# Patient Record
Sex: Female | Born: 1975 | Race: Black or African American | Hispanic: No | Marital: Married | Smoking: Former smoker
Health system: Southern US, Community
[De-identification: ages and names within clinical notes are randomized; demographics above are authoritative.]

## PROBLEM LIST (undated history)

## (undated) ENCOUNTER — Inpatient Hospital Stay (HOSPITAL_COMMUNITY): Payer: Self-pay

## (undated) DIAGNOSIS — E785 Hyperlipidemia, unspecified: Secondary | ICD-10-CM

## (undated) DIAGNOSIS — E118 Type 2 diabetes mellitus with unspecified complications: Secondary | ICD-10-CM

## (undated) DIAGNOSIS — I10 Essential (primary) hypertension: Secondary | ICD-10-CM

## (undated) DIAGNOSIS — J449 Chronic obstructive pulmonary disease, unspecified: Secondary | ICD-10-CM

## (undated) DIAGNOSIS — Z9981 Dependence on supplemental oxygen: Secondary | ICD-10-CM

## (undated) DIAGNOSIS — I471 Supraventricular tachycardia, unspecified: Secondary | ICD-10-CM

## (undated) DIAGNOSIS — E119 Type 2 diabetes mellitus without complications: Secondary | ICD-10-CM

## (undated) DIAGNOSIS — A599 Trichomoniasis, unspecified: Secondary | ICD-10-CM

## (undated) HISTORY — DX: Chronic obstructive pulmonary disease, unspecified: J44.9

## (undated) HISTORY — PX: TUBAL LIGATION: SHX77

## (undated) HISTORY — DX: Supraventricular tachycardia: I47.1

## (undated) HISTORY — DX: Hyperlipidemia, unspecified: E78.5

## (undated) HISTORY — PX: OTHER SURGICAL HISTORY: SHX169

## (undated) HISTORY — DX: Type 2 diabetes mellitus with unspecified complications: E11.8

## (undated) HISTORY — DX: Type 2 diabetes mellitus without complications: E11.9

## (undated) HISTORY — PX: SALPINGECTOMY: SHX328

## (undated) HISTORY — DX: Supraventricular tachycardia, unspecified: I47.10

## (undated) HISTORY — DX: Dependence on supplemental oxygen: Z99.81

---

## 2010-06-12 HISTORY — PX: OTHER SURGICAL HISTORY: SHX169

## 2013-05-28 ENCOUNTER — Inpatient Hospital Stay (HOSPITAL_COMMUNITY)
Admission: AD | Admit: 2013-05-28 | Discharge: 2013-05-29 | Disposition: A | Discharge status: Home / Self Care | Payer: Medicare Other | Source: Ambulatory Visit | Attending: Obstetrics and Gynecology | Admitting: Obstetrics and Gynecology

## 2013-05-28 ENCOUNTER — Encounter (HOSPITAL_COMMUNITY): Payer: Self-pay

## 2013-05-28 ENCOUNTER — Inpatient Hospital Stay (HOSPITAL_COMMUNITY): Payer: Medicare Other

## 2013-05-28 DIAGNOSIS — O00109 Unspecified tubal pregnancy without intrauterine pregnancy: Secondary | ICD-10-CM | POA: Insufficient documentation

## 2013-05-28 HISTORY — DX: Essential (primary) hypertension: I10

## 2013-05-28 LAB — URINALYSIS, ROUTINE W REFLEX MICROSCOPIC
Glucose, UA: NEGATIVE mg/dL
Protein, ur: NEGATIVE mg/dL
Specific Gravity, Urine: 1.025 (ref 1.005–1.030)
pH: 6 (ref 5.0–8.0)

## 2013-05-28 LAB — URINE MICROSCOPIC-ADD ON

## 2013-05-28 LAB — POCT PREGNANCY, URINE: Preg Test, Ur: POSITIVE — AB

## 2013-05-28 NOTE — MAU Note (Signed)
[facility] pregnancy [facility] at [facility], given due date of 01/03/14. Lower abdominal cramping tonight. Hx of ruptured ectopic 1 yr ago. Denies vaginal bleeding or discharge.

## 2013-05-29 ENCOUNTER — Encounter (HOSPITAL_COMMUNITY)
Admission: AD | Disposition: A | Discharge status: Home / Self Care | Payer: Self-pay | Source: Ambulatory Visit | Attending: Obstetrics and Gynecology

## 2013-05-29 ENCOUNTER — Encounter (HOSPITAL_COMMUNITY): Payer: Medicare Other | Admitting: Anesthesiology

## 2013-05-29 ENCOUNTER — Inpatient Hospital Stay (HOSPITAL_COMMUNITY): Payer: Medicare Other | Admitting: Anesthesiology

## 2013-05-29 ENCOUNTER — Encounter (HOSPITAL_COMMUNITY): Payer: Self-pay | Admitting: *Deleted

## 2013-05-29 HISTORY — PX: LAPAROSCOPY: SHX197

## 2013-05-29 LAB — CBC
MCH: 30.5 pg (ref 26.0–34.0)
Platelets: 300 10*3/uL (ref 150–400)
RBC: 3.83 MIL/uL — ABNORMAL LOW (ref 3.87–5.11)
RDW: 13.5 % (ref 11.5–15.5)
WBC: 9.2 10*3/uL (ref 4.0–10.5)

## 2013-05-29 LAB — GC/CHLAMYDIA PROBE AMP: GC Probe RNA: NEGATIVE

## 2013-05-29 LAB — COMPREHENSIVE METABOLIC PANEL
ALT: 29 U/L (ref 0–35)
AST: 21 U/L (ref 0–37)
Albumin: 3.6 g/dL (ref 3.5–5.2)
Alkaline Phosphatase: 79 U/L (ref 39–117)
CO2: 25 mEq/L (ref 19–32)
Chloride: 100 mEq/L (ref 96–112)
GFR calc non Af Amer: >90 mL/min (ref >90)
Glucose, Bld: 106 mg/dL — ABNORMAL HIGH (ref 70–99)
Potassium: 4.3 mEq/L (ref 3.5–5.1)
Sodium: 134 mEq/L — ABNORMAL LOW (ref 135–145)
Total Bilirubin: 0.1 mg/dL — ABNORMAL LOW (ref 0.3–1.2)

## 2013-05-29 LAB — HCG, QUANTITATIVE, PREGNANCY: hCG, Beta Chain, Quant, S: 5843 m[IU]/mL — ABNORMAL HIGH (ref <5)

## 2013-05-29 LAB — TYPE AND SCREEN
ABO/RH(D): B POS
Antibody Screen: NEGATIVE

## 2013-05-29 LAB — ABO/RH: ABO/RH(D): B POS

## 2013-05-29 LAB — WET PREP, GENITAL
Clue Cells Wet Prep HPF POC: NONE SEEN
Trich, Wet Prep: NONE SEEN

## 2013-05-29 SURGERY — LAPAROSCOPY OPERATIVE
Anesthesia: General | Site: Abdomen

## 2013-05-29 MED ORDER — VASOPRESSIN 20 UNIT/ML IJ SOLN
INTRAMUSCULAR | Status: DC | PRN
  Administered 2013-05-29: .6 [IU]

## 2013-05-29 MED ORDER — GLYCOPYRROLATE 0.2 MG/ML IJ SOLN
INTRAMUSCULAR | Status: DC | PRN
  Administered 2013-05-29: .6 mg via INTRAVENOUS

## 2013-05-29 MED ORDER — SODIUM CHLORIDE 0.9 % IJ SOLN
INTRAMUSCULAR | Status: DC | PRN
  Administered 2013-05-29: 3 mL via INTRAVENOUS

## 2013-05-29 MED ORDER — HYDROCODONE-ACETAMINOPHEN 5-325 MG PO TABS
ORAL_TABLET | ORAL | Status: AC
  Administered 2013-05-29: 2 via ORAL
  Filled 2013-05-29: qty 2

## 2013-05-29 MED ORDER — METOCLOPRAMIDE HCL 5 MG/ML IJ SOLN
10.0000 mg | Freq: Once | INTRAMUSCULAR | Status: AC
  Administered 2013-05-29: 10 mg via INTRAVENOUS
  Filled 2013-05-29: qty 2

## 2013-05-29 MED ORDER — SUCCINYLCHOLINE CHLORIDE 20 MG/ML IJ SOLN
INTRAMUSCULAR | Status: DC | PRN
  Administered 2013-05-29: 80 mg via INTRAVENOUS

## 2013-05-29 MED ORDER — NEOSTIGMINE METHYLSULFATE 1 MG/ML IJ SOLN
INTRAMUSCULAR | Status: DC | PRN
  Administered 2013-05-29: 3 mg via INTRAVENOUS

## 2013-05-29 MED ORDER — HYDROMORPHONE HCL PF 2 MG/ML IJ SOLN: 2.0000 mg | Freq: Once | INTRAMUSCULAR | Status: DC

## 2013-05-29 MED ORDER — HYDROCODONE-ACETAMINOPHEN 5-325 MG PO TABS: 1.0000 | ORAL_TABLET | Freq: Four times a day (QID) | ORAL | Status: DC | PRN

## 2013-05-29 MED ORDER — KETOROLAC TROMETHAMINE 30 MG/ML IJ SOLN
INTRAMUSCULAR | Status: DC | PRN
  Administered 2013-05-29: 30 mg via INTRAVENOUS

## 2013-05-29 MED ORDER — HYDROCODONE-ACETAMINOPHEN 5-325 MG PO TABS
2.0000 | ORAL_TABLET | Freq: Once | ORAL | Status: AC
  Administered 2013-05-29: 2 via ORAL

## 2013-05-29 MED ORDER — METOCLOPRAMIDE HCL 5 MG/ML IJ SOLN: 10.0000 mg | Freq: Once | INTRAMUSCULAR | Status: DC | PRN

## 2013-05-29 MED ORDER — KETOROLAC TROMETHAMINE 30 MG/ML IJ SOLN: 15.0000 mg | Freq: Once | INTRAMUSCULAR | Status: DC | PRN

## 2013-05-29 MED ORDER — MEPERIDINE HCL 25 MG/ML IJ SOLN: 6.2500 mg | INTRAMUSCULAR | Status: DC | PRN

## 2013-05-29 MED ORDER — FAMOTIDINE IN NACL 20-0.9 MG/50ML-% IV SOLN
20.0000 mg | Freq: Once | INTRAVENOUS | Status: AC
  Administered 2013-05-29: 20 mg via INTRAVENOUS
  Filled 2013-05-29: qty 50

## 2013-05-29 MED ORDER — LACTATED RINGERS IV SOLN
INTRAVENOUS | Status: DC
  Administered 2013-05-29 (×2): via INTRAVENOUS

## 2013-05-29 MED ORDER — FENTANYL CITRATE 0.05 MG/ML IJ SOLN
25.0000 ug | INTRAMUSCULAR | Status: DC | PRN
  Administered 2013-05-29 (×2): 50 ug via INTRAVENOUS

## 2013-05-29 MED ORDER — LIDOCAINE HCL (CARDIAC) 20 MG/ML IV SOLN
INTRAVENOUS | Status: DC | PRN
  Administered 2013-05-29 (×2): 50 mg via INTRAVENOUS

## 2013-05-29 MED ORDER — IBUPROFEN 600 MG PO TABS: 600.0000 mg | ORAL_TABLET | Freq: Four times a day (QID) | ORAL | Status: DC | PRN

## 2013-05-29 MED ORDER — LACTATED RINGERS IR SOLN
Status: DC | PRN
  Administered 2013-05-29: 3000 mL

## 2013-05-29 MED ORDER — DEXAMETHASONE SODIUM PHOSPHATE 4 MG/ML IJ SOLN
INTRAMUSCULAR | Status: DC | PRN
  Administered 2013-05-29: 10 mg via INTRAVENOUS

## 2013-05-29 MED ORDER — PROPOFOL 10 MG/ML IV BOLUS
INTRAVENOUS | Status: DC | PRN
  Administered 2013-05-29: 150 mg via INTRAVENOUS

## 2013-05-29 MED ORDER — MIDAZOLAM HCL 5 MG/5ML IJ SOLN
INTRAMUSCULAR | Status: DC | PRN
  Administered 2013-05-29: 2 mg via INTRAVENOUS

## 2013-05-29 MED ORDER — FENTANYL CITRATE 0.05 MG/ML IJ SOLN
INTRAMUSCULAR | Status: DC | PRN
  Administered 2013-05-29: 100 ug via INTRAVENOUS
  Administered 2013-05-29: 50 ug via INTRAVENOUS
  Administered 2013-05-29: 100 ug via INTRAVENOUS

## 2013-05-29 MED ORDER — ONDANSETRON HCL 4 MG/2ML IJ SOLN
INTRAMUSCULAR | Status: DC | PRN
  Administered 2013-05-29: 4 mg via INTRAVENOUS

## 2013-05-29 MED ORDER — FENTANYL CITRATE 0.05 MG/ML IJ SOLN
INTRAMUSCULAR | Status: AC
  Administered 2013-05-29: 50 ug via INTRAVENOUS
  Filled 2013-05-29: qty 2

## 2013-05-29 MED ORDER — ROCURONIUM BROMIDE 100 MG/10ML IV SOLN
INTRAVENOUS | Status: DC | PRN
  Administered 2013-05-29: 30 mg via INTRAVENOUS

## 2013-05-29 SURGICAL SUPPLY — 33 items
BARRIER ADHS 3X4 INTERCEED (GAUZE/BANDAGES/DRESSINGS) IMPLANT
BENZOIN TINCTURE PRP APPL 2/3 (GAUZE/BANDAGES/DRESSINGS) IMPLANT
CATH ROBINSON RED A/P 16FR (CATHETERS) IMPLANT
CLOTH BEACON ORANGE TIMEOUT ST (SAFETY) ×2 IMPLANT
DERMABOND ADVANCED (GAUZE/BANDAGES/DRESSINGS) ×1
DERMABOND ADVANCED .7 DNX12 (GAUZE/BANDAGES/DRESSINGS) ×1 IMPLANT
EVACUATOR SMOKE 8.L (FILTER) ×2 IMPLANT
GLOVE BIO SURGEON ST LM GN SZ9 (GLOVE) ×2 IMPLANT
GLOVE BIOGEL PI IND STRL 9 (GLOVE) ×2 IMPLANT
GLOVE BIOGEL PI INDICATOR 9 (GLOVE) ×2
GOWN PREVENTION PLUS LG XLONG (DISPOSABLE) ×2 IMPLANT
GOWN PREVENTION PLUS XLARGE (GOWN DISPOSABLE) IMPLANT
GOWN STRL REIN 3XL LVL4 (GOWN DISPOSABLE) ×2 IMPLANT
NEEDLE INSUFFLATION 120MM (ENDOMECHANICALS) ×2 IMPLANT
NEEDLE SPNL 22GX1.5 QUINCKE BK (NEEDLE) ×2 IMPLANT
NEEDLE SPNL 22GX3.5 QUINCKE BK (NEEDLE) ×2 IMPLANT
NS IRRIG 1000ML POUR BTL (IV SOLUTION) ×2 IMPLANT
PACK LAPAROSCOPY BASIN (CUSTOM PROCEDURE TRAY) ×2 IMPLANT
POUCH SPECIMEN RETRIEVAL 10MM (ENDOMECHANICALS) IMPLANT
PROTECTOR NERVE ULNAR (MISCELLANEOUS) ×2 IMPLANT
SCALPEL HARMONIC ACE (MISCELLANEOUS) IMPLANT
SCISSORS LAP 5X35 DISP (ENDOMECHANICALS) IMPLANT
SET IRRIG TUBING LAPAROSCOPIC (IRRIGATION / IRRIGATOR) ×2 IMPLANT
STRIP CLOSURE SKIN 1/2X4 (GAUZE/BANDAGES/DRESSINGS) IMPLANT
STRIP CLOSURE SKIN 1/4X3 (GAUZE/BANDAGES/DRESSINGS) IMPLANT
SUT VIC AB 4-0 PS2 27 (SUTURE) ×2 IMPLANT
SUT VICRYL 0 UR6 27IN ABS (SUTURE) ×4 IMPLANT
SYRINGE 10CC LL (SYRINGE) ×2 IMPLANT
TOWEL OR 17X24 6PK STRL BLUE (TOWEL DISPOSABLE) ×4 IMPLANT
TRAY FOLEY CATH 14FR (SET/KITS/TRAYS/PACK) IMPLANT
TROCAR OPTI TIP 5M 100M (ENDOMECHANICALS) ×2 IMPLANT
TROCAR XCEL DIL TIP R 11M (ENDOMECHANICALS) ×2 IMPLANT
WATER STERILE IRR 1000ML POUR (IV SOLUTION) IMPLANT

## 2013-05-29 NOTE — Anesthesia Preprocedure Evaluation (Signed)
Anesthesia Evaluation  Patient identified by MRN, date of birth, ID band Patient awake    Reviewed: Allergy & Precautions, H&P , NPO status , Patient's Chart, lab work & pertinent test results, reviewed documented beta blocker date and time   History of Anesthesia Complications Negative for: history of anesthetic complications  Airway Mallampati: II TM Distance: >3 FB Neck ROM: full    Dental  (+) Edentulous Upper and Upper Dentures   Pulmonary Current Smoker,  breath sounds clear to auscultation  Pulmonary exam normal       Cardiovascular negative cardio ROS  Rhythm:regular Rate:Normal     Neuro/Psych negative neurological ROS  negative psych ROS   GI/Hepatic negative GI ROS, Neg liver ROS,   Endo/Other  negative endocrine ROS  Renal/GU negative Renal ROS  negative genitourinary   Musculoskeletal   Abdominal   Peds  Hematology negative hematology ROS (+)   Anesthesia Other Findings Last ate at 5 pm  Reproductive/Obstetrics (+) Pregnancy (live ectopic pregnancy, right tube)                           Anesthesia Physical Anesthesia Plan  ASA: II and emergent  Anesthesia Plan: General ETT and Rapid Sequence   Post-op Pain Management:    Induction:   Airway Management Planned:   Additional Equipment:   Intra-op Plan:   Post-operative Plan:   Informed Consent: I have reviewed the patients History and Physical, chart, labs and discussed the procedure including the risks, benefits and alternatives for the proposed anesthesia with the patient or authorized representative who has indicated his/her understanding and acceptance.   Dental Advisory Given  Plan Discussed with: CRNA and Surgeon  Anesthesia Plan Comments:         Anesthesia Quick Evaluation

## 2013-05-29 NOTE — Anesthesia Postprocedure Evaluation (Signed)
  Anesthesia Post-op Note  Anesthesia Post Note  Patient: Michelle Duffy  Procedure(s) Performed: Procedure(s) (LRB): LAPAROSCOPY OPERATIVE with Right Salpingostomy (N/A)  Anesthesia type: General  Patient location: PACU  Post pain: Pain level controlled  Post assessment: Post-op Vital signs reviewed  Last Vitals:  Filed Vitals:   05/29/13 0430  BP: 97/64  Pulse: 97  Temp:   Resp: 18    Post vital signs: Reviewed  Level of consciousness: sedated  Complications: No apparent anesthesia complications

## 2013-05-29 NOTE — MAU Provider Note (Signed)
Chief Complaint: Abdominal Pain and Possible Pregnancy   First Provider Initiated Contact with Patient 05/29/13 0044     SUBJECTIVE HPI: Michelle Duffy is a 37 y.o. [id number] at [redacted]w[redacted]d by LMP who presents with mid low abd cramping tonight, pos UPT at [facility]. Hx of left ruptured ectopic 1 yr ago w/ salpingectomy and ectopic Tx w/ MTX in 2012. S/P tubal reversal. Denies fever, chills, vaginal bleeding or discharge. Pain 101/10 at worst. 7/10 now. Has not tried anything for the pain.   Last solids 1700. Sip of water ~2130. She states she is in a mutually monogamous relationship.  No past medical history on file. OB History  Gravida Para Term Preterm AB SAB TAB Ectopic Multiple Living  7 4 3 1 2   2  3     # Outcome Date GA Lbr Len/2nd Weight Sex Delivery Anes PTL Lv  7 CUR           6 ECT 2013             Comments: ruptured. left salpingectomy  5 ECT 2012             Comments: MTX  4 PRE 2003 [redacted]w[redacted]d    LTCS   Y  3 TRM 2001     LTCS   SB  2 TRM 1996     LTCS   Y  1 TRM 1995     SVD   Y     No past surgical history on file. History   Social History  . Marital Status: Single    Spouse Name: N/A    Number of Children: N/A  . Years of Education: N/A   Occupational History  . Not on file.   Social History Main Topics  . Smoking status: Not on file  . Smokeless tobacco: Not on file  . Alcohol Use: Not on file  . Drug Use: Not on file  . Sexual Activity: Not on file   Other Topics Concern  . Not on file   Social History Narrative  . No narrative on file   No current facility-administered medications on file prior to encounter.   No current outpatient prescriptions on file prior to encounter.   Allergies not on file  ROS: Pertinent items in HPI  OBJECTIVE Blood pressure 105/78, pulse 92, temperature 99.5 F (37.5 C), temperature source Oral, resp. rate 20, height 5\' 4"  (1.626 m), weight 55.157 kg (121 lb 9.6 oz), last menstrual period 04/18/2013, SpO2 100.00%. GENERAL:  Well-developed, well-nourished female in no mild distress. Anxious and tearful. HEENT: Normocephalic HEART: normal rate  RRR without murmur RESP: normal effort, lungs clear ABDOMEN: Soft, non-tender.  Well healed laparoscopic and pfannensteil incisions EXTREMITIES: Nontender, no edema NEURO: Alert and oriented SPECULUM EXAM: physiologic discharge, with GC/chl collected, wet prep in process, lite blood tinge to discharge  LAB RESULTS Results for orders placed during the hospital encounter of 05/28/13 (from the past 24 hour(s))  URINALYSIS, ROUTINE W REFLEX MICROSCOPIC     Status: Abnormal   Collection Time    05/28/13 10:59 PM      Result Value Range   Color, Urine YELLOW  YELLOW   APPearance HAZY (*) CLEAR   Specific Gravity, Urine 1.025  1.005 - 1.030   pH 6.0  5.0 - 8.0   Glucose, UA NEGATIVE  NEGATIVE mg/dL   Hgb urine dipstick MODERATE (*) NEGATIVE   Bilirubin Urine NEGATIVE  NEGATIVE   Ketones, ur NEGATIVE  NEGATIVE mg/dL  Protein, ur NEGATIVE  NEGATIVE mg/dL   Urobilinogen, UA 0.2  0.0 - 1.0 mg/dL   Nitrite NEGATIVE  NEGATIVE   Leukocytes, UA TRACE (*) NEGATIVE  URINE MICROSCOPIC-ADD ON     Status: None   Collection Time    05/28/13 10:59 PM      Result Value Range   Squamous Epithelial / LPF RARE  RARE   WBC, UA 0-2  <3 WBC/hpf   RBC / HPF 0-2  <3 RBC/hpf   Bacteria, UA RARE  RARE  POCT PREGNANCY, URINE     Status: Abnormal   Collection Time    05/28/13 11:04 PM      Result Value Range   Preg Test, Ur POSITIVE (*) NEGATIVE  ABO/RH     Status: None   Collection Time    05/28/13 11:40 PM      Result Value Range   ABO/RH(D) B POS    CBC     Status: Abnormal   Collection Time    05/28/13 11:40 PM      Result Value Range   WBC 9.2  4.0 - 10.5 K/uL   RBC 3.83 (*) 3.87 - 5.11 MIL/uL   Hemoglobin 11.7 (*) 12.0 - 15.0 g/dL   HCT 16.1 (*) 09.6 - 04.5 %   MCV 87.7  78.0 - 100.0 fL   MCH 30.5  26.0 - 34.0 pg   MCHC 34.8  30.0 - 36.0 g/dL   RDW 40.9  81.1 - 91.4 %    Platelets 300  150 - 400 K/uL   IMAGING US Ob Comp Less 14 Wks  05/29/2013   CLINICAL DATA:  Cramping, pregnant, 5 days 5 weeks by LMP, history of ectopic pregnancy x 2  EXAM: OBSTETRIC <14 WK [place] AND TRANSVAGINAL OB US  TECHNIQUE: Both transabdominal and transvaginal ultrasound examinations were performed for complete evaluation of the gestation as well as the maternal uterus, adnexal regions, and pelvic cul-de-sac. Transvaginal technique was performed to assess early pregnancy.  COMPARISON:  None  FINDINGS: Intrauterine gestational sac: Not identified  In the right adnexa, a ring-like nodule is identified measuring 2.4 x 1.7 x 1.6 cm in size, separate from right ovary.  This contains a central sac with a fetal pole 2.8 mm length.  Fetal cardiac activity detected at 118 beats per min.  Finding is consistent with a live ectopic pregnancy in the right adnexa.  Maternal uterus/adnexae:  Moderate free pelvic fluid.  Uterus otherwise normal appearance.  Unremarkable ovaries.  No additional adnexal masses.  IMPRESSION: Live right adnexal ectopic pregnancy, with nodule measuring 2.4 x 1.7 x 1.6 cm in size.  Moderate free pelvic fluid.  Findings called to [place] midwife in MAU on 05/29/2013 at 0015 hr.   Electronically Signed   By: Ulyses Southward M.D.   On: 05/29/2013 00:16   US Ob Transvaginal  05/29/2013   CLINICAL DATA:  Cramping, pregnant, 5 days 5 weeks by LMP, history of ectopic pregnancy x 2  EXAM: OBSTETRIC <14 WK [place] AND TRANSVAGINAL OB US  TECHNIQUE: Both transabdominal and transvaginal ultrasound examinations were performed for complete evaluation of the gestation as well as the maternal uterus, adnexal regions, and pelvic cul-de-sac. Transvaginal technique was performed to assess early pregnancy.  COMPARISON:  None  FINDINGS: Intrauterine gestational sac: Not identified  In the right adnexa, a ring-like nodule is identified measuring 2.4 x 1.7 x 1.6 cm in size, separate from right ovary.  This contains  a central sac with a fetal  pole 2.8 mm length.  Fetal cardiac activity detected at 118 beats per min.  Finding is consistent with a live ectopic pregnancy in the right adnexa.  Maternal uterus/adnexae:  Moderate free pelvic fluid.  Uterus otherwise normal appearance.  Unremarkable ovaries.  No additional adnexal masses.  IMPRESSION: Live right adnexal ectopic pregnancy, with nodule measuring 2.4 x 1.7 x 1.6 cm in size.  Moderate free pelvic fluid.  Findings called to [place] midwife in MAU on 05/29/2013 at 0015 hr.   Electronically Signed   By: Ulyses Southward M.D.   On: 05/29/2013 00:16   MAU COURSE Dr. Emelda Fear notified of [place] finding. Prep for OR.   ASSESSMENT 1. Tubal ectopic pregnancy    PLAN Prep for OR. Dr. Emelda Fear assuming care of pt.   [place], CNM 05/29/2013  12:17 AM   Patient interviewed, results reviewed, recommended procedure of laparoscopic removal of ectopic discussed with the patient, including risks, alternative therapies such as methotrexate, which was familiar to patient from prior ectopics x 2 on the left, and patient concerns answered.  Pt is nervous, as she is with prior surgeries per her report, and she understands that there may be unanticipated findings at surgery that require modification of the surgical plan, such as bleeding requiring removal of tube entirely rather than the anticipated linear salpingostomy, or rare complications requiring further surgery or transfusions. Patient last p.o. Intake at 5 pm, so will proceed promptly to surgery.

## 2013-05-29 NOTE — Transfer of Care (Signed)
Immediate Anesthesia Transfer of Care Note  Patient: Michelle Duffy  Procedure(s) Performed: Procedure(s): LAPAROSCOPY OPERATIVE with Right Salpingostomy (N/A)  Patient Location: PACU  Anesthesia Type:General  Level of Consciousness: awake and oriented  Airway & Oxygen Therapy: Patient Spontanous Breathing and Patient connected to nasal cannula oxygen  Post-op Assessment: Report given to PACU RN and Post -op Vital signs reviewed and stable  Post vital signs: Reviewed and stable  Complications: No apparent anesthesia complications

## 2013-05-29 NOTE — Op Note (Signed)
See the details included in the brief operative note

## 2013-05-29 NOTE — Brief Op Note (Signed)
05/28/2013 - 05/29/2013  3:14 AM  PATIENT:  Michelle Duffy  37 y.o. female  PRE-OPERATIVE DIAGNOSIS:  Ectopic Pregnancy  POST-OPERATIVE DIAGNOSIS:  Ectopic Pregnancy  PROCEDURE:  Procedure(s): LAPAROSCOPY OPERATIVE with Right Salpingostomy (N/A)  SURGEON:  Surgeon(s) and Role:    * Tilda Burrow, MD - Primary  PHYSICIAN ASSISTANT:   ASSISTANTS: blackwell CST   ANESTHESIA:   general  EBL:  Total I/O In: 1000 [I.V.:1000] Out: -   BLOOD ADMINISTERED:none  DRAINS: none   LOCAL MEDICATIONS USED:  NONE  SPECIMEN:  No Specimen  DISPOSITION OF SPECIMEN:  PATHOLOGY  COUNTS:  YES  TOURNIQUET:  * No tourniquets in log *  DICTATION: .Dragon Dictation  PLAN OF CARE: Discharge to home after PACU  PATIENT DISPOSITION:  PACU - hemodynamically stable.   Delay start of Pharmacological VTE agent (>24hrs) due to surgical blood loss or risk of bleeding: not applicable  Details of procedure: Patient was taken to the operating room prepped and draped for combined abdominal and vaginal procedure with Foley Catheter in Pl., Hulka tenaculum attached the uterus for manipulation timeout was conducted prior to beginning this procedure and procedure confirmed by surgical team. Umbilical transverse incision was made 1 cm in length, suprapubic transverse and right lower quadrant transverse incisions also made. Was carefully entered through the umbilicus and water droplet technique used to confirm intraperitoneal location, with pneumoperitoneum achieved using CO2 under 4 mm mercury pressure. Laparoscopic trocar was inserted under direct visualization, blunt tip, and then the abdomen visualized. There was evidence of bleeding in the abdominal cavity. Suprapubic and right lower quadrant trochars were inserted under direct visualization. Some omental adhesions to the anterior abdominal wall were noted. Attention was directed to the pelvis where the right ectopic could be confirmed just lateral to the  cornu, at the site of the prior tubal reanastomosis. Irrigation the pelvis was performed irrigating and removing the old blood and clots. Approximately 50 cc of old blood and clots were removed. Next tube was felt that it could be salvaged oh Pitressin was injected 3 cc beneath the ectopic and on the surface of the ectopic. Laparoscopic scissors were used to perform an antimesenteric incision into the tube identified and the ectopic. The ectopic could be shelled out in 2 pieces from the ectopic bed and then and then the bed irrigated confirming smooth removal of the ectopic. 5 mm suprapubic camera was then used to look through the right lateral port where the Endo Catch bag was placed through the umbilicus and the specimen in the Endo Catch bag and extracted. Pelvis was irrigated. Specimen was removed, the ectopic site confirmed as hemostatic.  Abdomen was deflated, the suprapubic site closed with 0 Vicryl at the fascial level and subcuticular 4-0 Vicryl used close the skin. Steri-Strips were applied patient to recovery in stable condition blood type confirmed as Rh+

## 2013-05-29 NOTE — MAU Note (Signed)
Pt complaining of pain to right side of pelvi

## 2013-05-30 ENCOUNTER — Encounter (HOSPITAL_COMMUNITY): Payer: Self-pay | Admitting: Obstetrics and Gynecology

## 2013-06-04 ENCOUNTER — Encounter: Payer: Medicare Other | Admitting: Family

## 2013-06-04 NOTE — Progress Notes (Signed)
Pt. Came in to clinic today assuming she had an appointment to be seen as Dr.Ferguson had told her to not return to work until she was seen at her appointment; appointment was New OB appointment and therefore pt. Cannot be seen today. Pt. Had ectopic pregnancy and right salpingostomy done laproscopically on 12/18. Pt. Requested a note as to when she can return to work as the note Dr.Ferguson gave her only covered her until today. Spoke to Dr. Jolayne Panther who stated pt. Could return to work. Further spoke to Dr. Debroah Loop who stated pt. Should limit activity and weight lifting to 25lbs for 2 weeks. 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 Wrote pt. Letter stating she could return to work on 04/12/13, which would put pt. At two weeks past surgery. Informed pt. She should be able to return to work without limitations at this time but to listen to her body and lift the lighter boxes if possible. Pt. Verbalized understanding. Pt. Also asked what she could take for incisional pain, informed pt. She could take ibuprofen or motrin but to monitor for s/s of infection which would be colored or "funny" smelling discharge, fever, redness/warmth at site, severe pain. Informed pt. To go to MAU if this occurs. Pt. Verbalized understanding and gratitude and had no further questions or concerns.

## 2013-06-26 ENCOUNTER — Encounter: Payer: Self-pay | Admitting: Obstetrics and Gynecology

## 2013-06-26 ENCOUNTER — Ambulatory Visit (INDEPENDENT_AMBULATORY_CARE_PROVIDER_SITE_OTHER): Payer: Medicare Other | Admitting: Obstetrics and Gynecology

## 2013-06-26 VITALS — BP 129/88 | HR 105 | Ht 64.0 in | Wt 123.8 lb

## 2013-06-26 DIAGNOSIS — Z3049 Encounter for surveillance of other contraceptives: Secondary | ICD-10-CM

## 2013-06-26 DIAGNOSIS — Z9889 Other specified postprocedural states: Secondary | ICD-10-CM

## 2013-06-26 DIAGNOSIS — Z09 Encounter for follow-up examination after completed treatment for conditions other than malignant neoplasm: Secondary | ICD-10-CM

## 2013-06-26 DIAGNOSIS — Z8742 Personal history of other diseases of the female genital tract: Secondary | ICD-10-CM

## 2013-06-26 DIAGNOSIS — Z8759 Personal history of other complications of pregnancy, childbirth and the puerperium: Secondary | ICD-10-CM | POA: Insufficient documentation

## 2013-06-26 LAB — HCG, QUANTITATIVE, PREGNANCY: HCG, BETA CHAIN, QUANT, S: 261 m[IU]/mL

## 2013-06-26 MED ORDER — MEDROXYPROGESTERONE ACETATE 104 MG/0.65ML ~~LOC~~ SUSP: 104.0000 mg | Freq: Once | SUBCUTANEOUS | Status: DC

## 2013-06-26 MED ORDER — MEDROXYPROGESTERONE ACETATE 104 MG/0.65ML ~~LOC~~ SUSP
104.0000 mg | SUBCUTANEOUS | Status: AC
  Administered 2013-06-26: 104 mg via SUBCUTANEOUS

## 2013-06-26 MED ORDER — IBUPROFEN 800 MG PO TABS: 800.0000 mg | ORAL_TABLET | Freq: Three times a day (TID) | ORAL | Status: DC | PRN

## 2013-06-26 NOTE — Progress Notes (Signed)
Would like to get birth control.

## 2013-06-26 NOTE — Progress Notes (Signed)
Patient ID: Michelle Duffy, female   DOB: 12-20-1975, 38 y.o.   MRN: [id number] 38 yo [id number] s/p right salpingostomy on 12/18 secondary to ectopic pregnancy. Patient reports doing well postoperatively and is without complaints. This was a planned pregnancy. Patient has had a tubal reversal in 2009 and this has been her 4th ectopic pregnancy since the reversal. She has had a left salpingectomy previously. Patient desires birth control as she does not want to try to conceive in the near future.  GENERAL: Well-developed, well-nourished female in no acute distress.  ABDOMEN: Soft, nontender, nondistended.  Incision sites completely healed PELVIC: Normal external female genitalia. Vagina is pink and rugated.  Normal discharge. Normal appearing cervix. Uterus is normal in size. No adnexal mass or tenderness. EXTREMITIES: No cyanosis, clubbing, or edema, 2+ distal pulses.  A/P 38 yo s/p right salpingostomy on 05/29/2013 here for post op check - patient medically cleared to resume all activities of daily living - Patient desires depo-provera for contraception for now. Advised to use back up birth control - quant HCG today - Patient advised to try to conceive with the help of an infertility specialist.  - RTC prn

## 2013-06-30 ENCOUNTER — Telehealth: Payer: Self-pay | Admitting: *Deleted

## 2013-06-30 NOTE — Telephone Encounter (Signed)
Pt. Called. Informed pt. We would like her to come in for HCG lab draw. Pt. States she can come in 07/03/13 at 0800. Informed pt. I will have her put on the schedule. Pt. Verbalizes understanding and has no further questions or concerns.

## 2013-06-30 NOTE — Telephone Encounter (Signed)
Message copied by Mannie StabileASH, Trica Usery A on Mon Jun 30, 2013  4:11 PM ------      Message from: CONSTANT, PEGGY      Created: Mon Jun 30, 2013 12:02 PM       Please have patient return this week for repeat quant HCG. Pregnancy hormone following salpingostomy is still present.            Thanks             Clinical cytogeneticisteggy ------

## 2013-06-30 NOTE — Telephone Encounter (Signed)
Called patient and she wasn't home, asked female that answered to have her call us back. I also sent a mychart message to the patient asking her to call us to make lab appt.

## 2013-07-03 ENCOUNTER — Other Ambulatory Visit: Payer: Medicare Other

## 2013-07-03 ENCOUNTER — Telehealth: Payer: Self-pay | Admitting: *Deleted

## 2013-07-03 DIAGNOSIS — O009 Unspecified ectopic pregnancy without intrauterine pregnancy: Secondary | ICD-10-CM

## 2013-07-03 DIAGNOSIS — Z8759 Personal history of other complications of pregnancy, childbirth and the puerperium: Secondary | ICD-10-CM

## 2013-07-03 DIAGNOSIS — Z9889 Other specified postprocedural states: Secondary | ICD-10-CM

## 2013-07-03 LAB — HCG, QUANTITATIVE, PREGNANCY: hCG, Beta Chain, Quant, S: 11.8 m[IU]/mL

## 2013-07-03 NOTE — Telephone Encounter (Signed)
Message copied by Mannie StabileASH, Caylan Schifano A on Thu Jul 03, 2013  4:25 PM ------      Message from: CONSTANT, PEGGY      Created: Thu Jul 03, 2013 11:54 AM       I sent a message to [place] regarding this patient already. Please inform her that the pregnancy hormone is dropping. She will need to come back one more time next week for repeat quant HCG and to confirm that ectopic pregnancy has resolved.            Thanks            Clinical cytogeneticisteggy ------

## 2013-07-03 NOTE — Telephone Encounter (Signed)
Called patient and left her a message that she will need to return for one more lab visit a week from today.

## 2013-07-10 ENCOUNTER — Other Ambulatory Visit: Payer: Medicare Other

## 2013-07-17 ENCOUNTER — Encounter: Payer: Self-pay | Admitting: *Deleted

## 2013-09-19 ENCOUNTER — Ambulatory Visit: Payer: Medicare Other

## 2014-04-13 ENCOUNTER — Encounter: Payer: Self-pay | Admitting: Obstetrics and Gynecology

## 2014-08-24 ENCOUNTER — Telehealth: Payer: Self-pay

## 2014-08-24 NOTE — Telephone Encounter (Signed)
Patient called stating she had positive pregnancy test yesterday and wants to know how to get an appointment to be seen. Per chart review-- patient is a diabetic and has HTN. Called patient and confirmed these diagnoses-- patient therefore considered high risk and will need to seek care here in our clinic. Patient unsure of LMP because she was on depo injection and periods were irregular, though reports having a period in the middle of February. Patient is worried about another ectopic pregnancy as she had one last December and was asymptomatic-- wants U/S.  Informed patient she will need to come into clinic for pregnancy test and when positive we can discuss U/S given that she is unsure of dating. Patient states she can come in at 1300 tomorrow 08/25/14. Informed patient she will be added to schedule. No further questions at this time. Message sent to admin pool to add patient to schedule.

## 2014-08-25 ENCOUNTER — Ambulatory Visit (INDEPENDENT_AMBULATORY_CARE_PROVIDER_SITE_OTHER): Payer: Medicare Other

## 2014-08-25 ENCOUNTER — Other Ambulatory Visit: Payer: Self-pay | Admitting: Family Medicine

## 2014-08-25 ENCOUNTER — Encounter: Payer: Self-pay | Admitting: Obstetrics & Gynecology

## 2014-08-25 DIAGNOSIS — Z3490 Encounter for supervision of normal pregnancy, unspecified, unspecified trimester: Secondary | ICD-10-CM

## 2014-08-25 DIAGNOSIS — Z3201 Encounter for pregnancy test, result positive: Secondary | ICD-10-CM

## 2014-08-25 DIAGNOSIS — O3680X0 Pregnancy with inconclusive fetal viability, not applicable or unspecified: Secondary | ICD-10-CM

## 2014-08-25 LAB — POCT PREGNANCY, URINE: PREG TEST UR: POSITIVE — AB

## 2014-08-25 NOTE — Addendum Note (Signed)
Addended by: Faythe CasaBELLAMY, Kyung Muto M on: 08/25/2014 03:52 PM   Modules accepted: Orders

## 2014-08-25 NOTE — Progress Notes (Signed)
Pt here for pregnancy test.  Pregnancy test positive.  Pt has decided to come to Clinics for prenatal care.  Pt states that her LMP was around 2/18-2/19.  Pt informed me that she has diabetes and has chronic hypertension which is controlled.  I informed pt that we will set her up with Diabetes Education on Monday separate from her NEW OB appt so that way she can start being managed.  I made an US appt for 3/23 @ 1045 for dating and viability.  Pt also had blood drawn initial prenatal blood draw today.  I informed pt that at her NEW OB appt, the provider will do a pap smear.  Pt agreed to the recommendations with no further questions.

## 2014-08-25 NOTE — Addendum Note (Signed)
Addended by: Faythe CasaBELLAMY, Naika Noto M on: 08/25/2014 03:53 PM   Modules accepted: Orders

## 2014-08-26 ENCOUNTER — Telehealth: Payer: Self-pay

## 2014-08-26 LAB — PRENATAL PROFILE (SOLSTAS)
Antibody Screen: NEGATIVE
BASOS PCT: 1 % (ref 0–1)
Basophils Absolute: 0.1 10*3/uL (ref 0.0–0.1)
EOS ABS: 0.1 10*3/uL (ref 0.0–0.7)
EOS PCT: 1 % (ref 0–5)
HCT: 38.9 % (ref 36.0–46.0)
HEMOGLOBIN: 12.8 g/dL (ref 12.0–15.0)
HIV 1&2 Ab, 4th Generation: NONREACTIVE
Hepatitis B Surface Ag: NEGATIVE
Lymphocytes Relative: 36 % (ref 12–46)
Lymphs Abs: 2.7 10*3/uL (ref 0.7–4.0)
MCH: 30.3 pg (ref 26.0–34.0)
MCHC: 32.9 g/dL (ref 30.0–36.0)
MCV: 92 fL (ref 78.0–100.0)
MPV: 9.6 fL (ref 8.6–12.4)
Monocytes Absolute: 0.5 10*3/uL (ref 0.1–1.0)
Monocytes Relative: 7 % (ref 3–12)
Neutro Abs: 4.1 10*3/uL (ref 1.7–7.7)
Neutrophils Relative %: 55 % (ref 43–77)
PLATELETS: 338 10*3/uL (ref 150–400)
RBC: 4.23 MIL/uL (ref 3.87–5.11)
RDW: 13.7 % (ref 11.5–15.5)
RH TYPE: POSITIVE
Rubella: 2.58 Index — ABNORMAL HIGH (ref <0.90)
WBC: 7.4 10*3/uL (ref 4.0–10.5)

## 2014-08-26 LAB — CULTURE, OB URINE
Colony Count: NO GROWTH
Organism ID, Bacteria: NO GROWTH

## 2014-08-26 LAB — HCG, QUANTITATIVE, PREGNANCY: hCG, Beta Chain, Quant, S: 73.4 m[IU]/mL

## 2014-08-26 NOTE — Telephone Encounter (Signed)
Called patient and informed her of results. Patient reports she can come in this Friday 08/28/14 at 0800 for repeat HCG-- STAT. Discussed ectopic precautions and advised she come to MAU for any severe pain. Patient verbalized understanding. No questions at this time.

## 2014-08-26 NOTE — Telephone Encounter (Signed)
-----   Message from Reva Boresanya S Pratt, MD sent at 08/26/2014  9:49 AM EDT ----- Can we dd BHCG to labs yesterday and get U/S sooner? Need to r/o ectopic.

## 2014-08-26 NOTE — Telephone Encounter (Signed)
-----   Message from Reva Boresanya S Pratt, MD sent at 08/26/2014  4:16 PM EDT ----- BHCG is low--needs f/u in 48 hours. U/s ok for now--needs to get ectopic precautions and know to come to MAU with concerns.

## 2014-08-26 NOTE — Telephone Encounter (Addendum)
Spoke to lab tech betsy Sherrie MustacheFisher-- she will call [facility] to had on HCG. Called patient and discussed need for earlier U/S-- patient wishes to have an early morning if possible. Called radiology and rescheduled U/S for the earliest possible appointment this Friday 08/28/14 at 1415. Patient informed of appointment-- verbalized understanding and stated she will be there.   1246: HCG added on to labs drawn from 08/25/14-- [facility] Melton KrebsBetsy Fisher. [facility]

## 2014-08-28 ENCOUNTER — Telehealth: Payer: Self-pay

## 2014-08-28 ENCOUNTER — Other Ambulatory Visit (HOSPITAL_COMMUNITY)
Admission: AD | Admit: 2014-08-28 | Discharge: 2014-08-28 | Disposition: A | Discharge status: Home / Self Care | Payer: Medicare Other | Source: Ambulatory Visit | Attending: Obstetrics & Gynecology | Admitting: Obstetrics & Gynecology

## 2014-08-28 ENCOUNTER — Ambulatory Visit (HOSPITAL_COMMUNITY)
Admission: RE | Admit: 2014-08-28 | Discharge: 2014-08-28 | Disposition: A | Discharge status: Home / Self Care | Payer: Medicare Other | Source: Ambulatory Visit | Attending: Obstetrics & Gynecology | Admitting: Obstetrics & Gynecology

## 2014-08-28 ENCOUNTER — Other Ambulatory Visit: Payer: Medicare Other

## 2014-08-28 DIAGNOSIS — O009 Unspecified ectopic pregnancy without intrauterine pregnancy: Secondary | ICD-10-CM

## 2014-08-28 DIAGNOSIS — Z36 Encounter for antenatal screening of mother: Secondary | ICD-10-CM | POA: Insufficient documentation

## 2014-08-28 DIAGNOSIS — Z3A00 Weeks of gestation of pregnancy not specified: Secondary | ICD-10-CM | POA: Insufficient documentation

## 2014-08-28 DIAGNOSIS — O3680X0 Pregnancy with inconclusive fetal viability, not applicable or unspecified: Secondary | ICD-10-CM

## 2014-08-28 DIAGNOSIS — Z8759 Personal history of other complications of pregnancy, childbirth and the puerperium: Secondary | ICD-10-CM

## 2014-08-28 LAB — HCG, QUANTITATIVE, PREGNANCY: hCG, Beta Chain, Quant, S: 261 m[IU]/mL — ABNORMAL HIGH (ref <5)

## 2014-08-28 NOTE — Telephone Encounter (Signed)
Pt informed of results. Will go to ultrasound as planned.

## 2014-08-28 NOTE — Progress Notes (Unsigned)
Pt not in any pain, has no bleeding.

## 2014-08-28 NOTE — Telephone Encounter (Addendum)
Spoke with Dr. Macon LargeAnyanwu patients beta level is okay and she should keep ultrasound appointment. Called patient and left message to call us back for results.

## 2014-08-30 ENCOUNTER — Inpatient Hospital Stay (HOSPITAL_COMMUNITY)
Admission: AD | Admit: 2014-08-30 | Discharge: 2014-08-30 | Disposition: A | Discharge status: Home / Self Care | Payer: Medicare Other | Source: Ambulatory Visit | Attending: Obstetrics and Gynecology | Admitting: Obstetrics and Gynecology

## 2014-08-30 ENCOUNTER — Encounter (HOSPITAL_COMMUNITY): Payer: Self-pay

## 2014-08-30 ENCOUNTER — Inpatient Hospital Stay (HOSPITAL_COMMUNITY): Payer: Medicare Other

## 2014-08-30 DIAGNOSIS — Z87891 Personal history of nicotine dependence: Secondary | ICD-10-CM | POA: Insufficient documentation

## 2014-08-30 DIAGNOSIS — R102 Pelvic and perineal pain: Secondary | ICD-10-CM | POA: Diagnosis not present

## 2014-08-30 DIAGNOSIS — O9989 Other specified diseases and conditions complicating pregnancy, childbirth and the puerperium: Secondary | ICD-10-CM | POA: Diagnosis not present

## 2014-08-30 DIAGNOSIS — O26899 Other specified pregnancy related conditions, unspecified trimester: Secondary | ICD-10-CM | POA: Diagnosis not present

## 2014-08-30 DIAGNOSIS — R1031 Right lower quadrant pain: Secondary | ICD-10-CM | POA: Diagnosis present

## 2014-08-30 DIAGNOSIS — Z3A01 Less than 8 weeks gestation of pregnancy: Secondary | ICD-10-CM | POA: Insufficient documentation

## 2014-08-30 LAB — URINALYSIS, ROUTINE W REFLEX MICROSCOPIC
Bilirubin Urine: NEGATIVE
GLUCOSE, UA: NEGATIVE mg/dL
Ketones, ur: NEGATIVE mg/dL
LEUKOCYTES UA: NEGATIVE
Nitrite: NEGATIVE
Protein, ur: NEGATIVE mg/dL
SPECIFIC GRAVITY, URINE: 1.02 (ref 1.005–1.030)
UROBILINOGEN UA: 0.2 mg/dL (ref 0.0–1.0)
pH: 6 (ref 5.0–8.0)

## 2014-08-30 LAB — URINE MICROSCOPIC-ADD ON

## 2014-08-30 LAB — HCG, QUANTITATIVE, PREGNANCY: HCG, BETA CHAIN, QUANT, S: 646 m[IU]/mL — AB (ref <5)

## 2014-08-30 MED ORDER — HYDROCODONE-ACETAMINOPHEN 5-325 MG PO TABS: 1.0000 | ORAL_TABLET | Freq: Four times a day (QID) | ORAL | Status: DC | PRN

## 2014-08-30 NOTE — MAU Note (Signed)
Pt presents with RLQ pain that started ago. Hx of ectopic on L side and hx of tubal reversal. Denies vaginal bleeding.

## 2014-08-30 NOTE — MAU Provider Note (Signed)
History     CSN: [id number]  Arrival date and time: 08/30/14 1720   None     Chief Complaint  Patient presents with  . Abdominal Pain   HPI This is a 39 y.o. female at [redacted]w[redacted]d who presents with c/o pain in the right lower quadrant. It started 30 minutes ago. It is sharp in nature. She has a history of a left ectopic and tube removal. Has also had other ectopic sna states this feels like one.   RN Note  Expand All Collapse All   Pt presents with RLQ pain that started ago. Hx of ectopic on L side and hx of tubal reversal. Denies vaginal bleeding.           OB History    Gravida Para Term Preterm AB TAB SAB Ectopic Multiple Living   Past Medical History  Diagnosis Date  . Hypertension   . Diabetes mellitus without complication     Past Surgical History  Procedure Laterality Date  . Revers    . Salpingectomy Left     ruptured ectopic  . Laparoscopy N/A 05/29/2013    Procedure: LAPAROSCOPY OPERATIVE with Right Salpingostomy;  Surgeon: Tilda Burrow, MD;  Location: [facility];  Service: Gynecology;  Laterality: N/A;    History reviewed. No pertinent family history.  History  Substance Use Topics  . Smoking status: Former Games developer  . Smokeless tobacco: Not on file  . Alcohol Use: No    Allergies: No Known Allergies  Prescriptions prior to admission  Medication Sig Dispense Refill Last Dose  . insulin NPH-regular Human (NOVOLIN 70/30) (70-30) 100 UNIT/ML injection Inject 22 Units into the skin 2 (two) times daily with a meal.    08/29/2014 at Unknown time  . lubiprostone (AMITIZA) 24 MCG capsule Take 24 mcg by mouth 2 (two) times daily with a meal.   Past Month at Unknown time  . metoprolol tartrate (LOPRESSOR) 25 MG tablet Take 25 mg by mouth 2 (two) times daily.   08/29/2014 at 1700  . HYDROcodone-acetaminophen (NORCO/VICODIN) 5-325 MG per tablet Take 1 tablet by mouth every 6 (six) hours as needed. (Patient not taking: Reported on  08/30/2014) 20 tablet 0 Not Taking  . ibuprofen (ADVIL,MOTRIN) 600 MG tablet Take 1 tablet (600 mg total) by mouth every 6 (six) hours as needed. (Patient not taking: Reported on 08/30/2014) 30 tablet 1 Not Taking  . ibuprofen (ADVIL,MOTRIN) 800 MG tablet Take 1 tablet (800 mg total) by mouth every 8 (eight) hours as needed. (Patient not taking: Reported on 08/30/2014) 60 tablet 1     Review of Systems  Constitutional: Negative for fever, chills and malaise/fatigue.  Gastrointestinal: Positive for abdominal pain. Negative for nausea, vomiting, diarrhea and constipation.  Genitourinary: Negative for dysuria.  Neurological: Negative for dizziness and headaches.  Psychiatric/Behavioral: Negative for depression. The patient is nervous/anxious.    Physical Exam   Blood pressure 133/76, pulse 116, temperature 98.2 F (36.8 C), temperature source Oral, resp. rate 18, last menstrual period 07/30/2014, unknown if currently breastfeeding.  Physical Exam  Constitutional: She is oriented to person, place, and time. She appears well-developed and well-nourished. No distress.  HENT:  Head: Normocephalic.  Cardiovascular: Normal rate, regular rhythm and normal heart sounds.   Respiratory: Effort normal and breath sounds normal.  GI: Soft. She exhibits no distension. There is tenderness (on right lower abdomen, mildly tender).  Musculoskeletal: Normal range  of motion.  Neurological: She is alert and oriented to person, place, and time.  Skin: Skin is warm and dry.  Psychiatric: She has a normal mood and affect.    MAU Course  Procedures  MDM Results for orders placed or performed during the hospital encounter of 08/30/14 (from the past 72 hour(s))  Urinalysis, Routine w reflex microscopic     Status: Abnormal   Collection Time: 08/30/14  5:26 PM  Result Value Ref Range   Color, Urine YELLOW YELLOW   APPearance CLEAR CLEAR   Specific Gravity, Urine 1.020 1.005 - 1.030   pH 6.0 5.0 - 8.0    Glucose, UA NEGATIVE NEGATIVE mg/dL   Hgb urine dipstick TRACE (A) NEGATIVE   Bilirubin Urine NEGATIVE NEGATIVE   Ketones, ur NEGATIVE NEGATIVE mg/dL   Protein, ur NEGATIVE NEGATIVE mg/dL   Urobilinogen, UA 0.2 0.0 - 1.0 mg/dL   Nitrite NEGATIVE NEGATIVE   Leukocytes, UA NEGATIVE NEGATIVE  Urine microscopic-add on     Status: Abnormal   Collection Time: 08/30/14  5:26 PM  Result Value Ref Range   Squamous Epithelial / LPF FEW (A) RARE   WBC, UA 3-6 <3 WBC/hpf   RBC / HPF 0-2 <3 RBC/hpf   Bacteria, UA FEW (A) RARE  hCG, quantitative, pregnancy     Status: Abnormal   Collection Time: 08/30/14  5:41 PM  Result Value Ref Range   hCG, Beta Chain, Quant, S 646 (H) <5 mIU/mL    Comment:          GEST. AGE      CONC.  (mIU/mL)   <=1 WEEK        5 - 50     2 WEEKS       50 - 500     3 WEEKS       100 - 10,000     4 WEEKS     1,000 - 30,000     5 WEEKS     3,500 - 115,000   6-8 WEEKS     12,000 - 270,000    12 WEEKS     15,000 - 220,000        FEMALE AND NON-PREGNANT FEMALE:     LESS THAN 5 mIU/mL    Results for Vicente SereneSKIPPER, Nikkia (MRN [id number]) as of 09/01/2014 03:18  Ref. Range 08/28/2014 09:02  hCG, Beta Chain, Quant, S Latest Range: <5 mIU/mL 261 (H)   [place] Ob Comp Less 14 Wks  08/28/2014   CLINICAL DATA:  First trimester pregnancy with uncertain fetal viability. Unsure of LMP. Previous ectopic pregnancy.  EXAM: OBSTETRIC <14 WK US AND TRANSVAGINAL OB US  TECHNIQUE: Both transabdominal and transvaginal ultrasound examinations were performed for complete evaluation of the gestation as well as the maternal uterus, adnexal regions, and pelvic cul-de-sac. Transvaginal technique was performed to assess early pregnancy.  COMPARISON:  05/28/2013  FINDINGS: No intrauterine gestational sac or other fluid collection visualized in endometrial cavity. No fibroids identified.  The right ovary is normal in appearance. Left ovary is not directly visualized by transabdominal or transvaginal sonography,  however no adnexal mass identified. No evidence of free fluid.  IMPRESSION: Pregnancy location not visualized sonographically. Differential diagnosis includes recent spontaneous abortion, IUP too early to visualize, and occult ectopic pregnancy. Recommend close follow up of quantitative B-HCG levels, and follow up US as clinically warranted.   Electronically Signed   By: Myles RosenthalJohn  Stahl M.D.   On: 08/28/2014 14:51   [place] Ob Transvaginal  08/30/2014   CLINICAL DATA:  Acute onset of right lower quadrant abdominal pain. Initial encounter.  EXAM: TRANSVAGINAL OB ULTRASOUND  TECHNIQUE: Transvaginal ultrasound was performed for complete evaluation of the gestation as well as the maternal uterus, adnexal regions, and pelvic cul-de-sac.  COMPARISON:  None.  FINDINGS: Intrauterine gestational sac: None seen.  Yolk sac:  N/A  Embryo:  N/A  Maternal uterus/adnexae: The right ovary is unremarkable in appearance. It measures 2.6 x 1.9 x 1.7 cm. The left ovary is not visualized on this study. There is no evidence for ovarian torsion.  The patient had severe right adnexal pain, and there is a small amount of complex fluid noted at the right adnexa.  IMPRESSION: 1. No intrauterine gestational sac seen at this time. No definite ectopic pregnancy seen. Given the quantitative beta HCG level of 646, this remains within normal limits. Would follow-up quantitative beta HCG levels closely, and perform follow-up pelvic ultrasound within 1 week, at which time an embryo would likely be visible (or when lack of an intrauterine embryo would be more suggestive of ectopic pregnancy). 2. Severe right adnexal pain, with small amount of complex fluid at the right adnexa. If the patient's symptoms persist, this would raise some degree of concern for ectopic pregnancy.   Electronically Signed   By: Roanna Raider M.D.   On: 08/30/2014 18:48   US Ob Transvaginal  08/28/2014   CLINICAL DATA:  First trimester pregnancy with uncertain fetal viability.  Unsure of LMP. Previous ectopic pregnancy.  EXAM: OBSTETRIC <14 WK [place] AND TRANSVAGINAL OB US  TECHNIQUE: Both transabdominal and transvaginal ultrasound examinations were performed for complete evaluation of the gestation as well as the maternal uterus, adnexal regions, and pelvic cul-de-sac. Transvaginal technique was performed to assess early pregnancy.  COMPARISON:  05/28/2013  FINDINGS: No intrauterine gestational sac or other fluid collection visualized in endometrial cavity. No fibroids identified.  The right ovary is normal in appearance. Left ovary is not directly visualized by transabdominal or transvaginal sonography, however no adnexal mass identified. No evidence of free fluid.  IMPRESSION: Pregnancy location not visualized sonographically. Differential diagnosis includes recent spontaneous abortion, IUP too early to visualize, and occult ectopic pregnancy. Recommend close follow up of quantitative B-HCG levels, and follow up US as clinically warranted.   Electronically Signed   By: Myles Rosenthal M.D.   On: 08/28/2014 14:51    Assessment and Plan  A:  Pregnancy at [redacted]w[redacted]d       Appropriate rise in HCG levels      Small amount of complex fluid in right adnexa       Cannot rule ectopic in or out yet  P;  Discussed with Dr Jolayne Panther       Will repeat Quant in 2 days and repeat US in a week.        Ectopic precautions       Followup in MAU 2 days  [facility] 08/30/2014, 5:56 PM

## 2014-08-30 NOTE — Discharge Instructions (Signed)
Abdominal Pain During Pregnancy °Belly (abdominal) pain is common during pregnancy. Most of the time, it is not a serious problem. Other times, it can be a sign that something is wrong with the pregnancy. Always tell your doctor if you have belly pain. °HOME CARE °Monitor your belly pain for any changes. The following actions may help you feel better: °· Do not have sex (intercourse) or put anything in your vagina until you feel better. °· Rest until your pain stops. °· Drink clear fluids if you feel sick to your stomach (nauseous). Do not eat solid food until you feel better. °· Only take medicine as told by your doctor. °· Keep all doctor visits as told. °GET HELP RIGHT AWAY IF:  °· You are bleeding, leaking fluid, or pieces of tissue come out of your vagina. °· You have more pain or cramping. °· You keep throwing up (vomiting). °· You have pain when you pee (urinate) or have blood in your pee. °· You have a fever. °· You do not feel your baby moving as much. °· You feel very weak or feel like passing out. °· You have trouble breathing, with or without belly pain. °· You have a very bad headache and belly pain. °· You have fluid leaking from your vagina and belly pain. °· You keep having watery poop (diarrhea). °· Your belly pain does not go away after resting, or the pain gets worse. °MAKE SURE YOU:  °· Understand these instructions. °· Will watch your condition. °· Will get help right away if you are not doing well or get worse. °Document Released: 05/17/2009 Document Revised: 01/29/2013 Document Reviewed: 12/26/2012 °[employer]® Patient Information ©2015 [employer]. This information is not intended to replace advice given to you by your health care provider. Make sure you discuss any questions you have with your health care provider. ° °

## 2014-08-31 ENCOUNTER — Ambulatory Visit: Payer: Medicare Other

## 2014-09-01 ENCOUNTER — Inpatient Hospital Stay (HOSPITAL_COMMUNITY)
Admission: AD | Admit: 2014-09-01 | Discharge: 2014-09-01 | Disposition: A | Discharge status: Home / Self Care | Payer: Medicare Other | Source: Ambulatory Visit | Attending: Family Medicine | Admitting: Family Medicine

## 2014-09-01 DIAGNOSIS — O9989 Other specified diseases and conditions complicating pregnancy, childbirth and the puerperium: Secondary | ICD-10-CM

## 2014-09-01 DIAGNOSIS — E349 Endocrine disorder, unspecified: Secondary | ICD-10-CM

## 2014-09-01 DIAGNOSIS — Z09 Encounter for follow-up examination after completed treatment for conditions other than malignant neoplasm: Secondary | ICD-10-CM

## 2014-09-01 DIAGNOSIS — Z3A01 Less than 8 weeks gestation of pregnancy: Secondary | ICD-10-CM | POA: Insufficient documentation

## 2014-09-01 DIAGNOSIS — R109 Unspecified abdominal pain: Secondary | ICD-10-CM | POA: Insufficient documentation

## 2014-09-01 LAB — HCG, QUANTITATIVE, PREGNANCY: HCG, BETA CHAIN, QUANT, S: 1836 m[IU]/mL — AB (ref <5)

## 2014-09-01 NOTE — MAU Note (Signed)
Called pt with lab results.  HCG level has gone up.  Pt instructed to keep appt as scheduled.  Reminded pt we are open 24/7, if she would start having any pain, bleeding or other problems- to return. Pt verbalized understanding and voiced her appreciation.

## 2014-09-01 NOTE — MAU Provider Note (Signed)
Michelle Duffy 39 y.o. [id number] @[redacted]w[redacted]d  presents to MAU for f/u HCG quant.  She denies any vaginal pain or bleeding.   She has had 4 HCG levels taken over the last 7 days with appropriate increases each time.   She reports she has NOB appt on 4/25.   She is advised to report to MAU for emergency.  Okay to be discharged home today.  PNV qd

## 2014-09-01 NOTE — MAU Note (Signed)
Here for follow up, did not know she had to stay.  Hx of 3 ectopics.  Denies pain today, feels so much better. No bleeding.

## 2014-09-02 ENCOUNTER — Telehealth: Payer: Self-pay | Admitting: General Practice

## 2014-09-02 ENCOUNTER — Telehealth: Payer: Self-pay

## 2014-09-02 ENCOUNTER — Ambulatory Visit (HOSPITAL_COMMUNITY): Payer: Medicare Other

## 2014-09-02 ENCOUNTER — Encounter: Payer: Self-pay | Admitting: Family Medicine

## 2014-09-02 DIAGNOSIS — Z8759 Personal history of other complications of pregnancy, childbirth and the puerperium: Secondary | ICD-10-CM

## 2014-09-02 NOTE — Telephone Encounter (Signed)
Patient sent mychart message asking about scheduling another U/S and wanting to review results. Per chart review, patient supposed to have f/u U/S 1 week from 08/30/14. Order for U/S placed. U/S scheduled for 09/07/14 at 1000. Attempted to contact patient. No answer. Left message informing her of U/S appointment and advised she call with any further questions regarding U/S results.

## 2014-09-02 NOTE — Telephone Encounter (Signed)
Patient called back into front office requesting results. Informed patient of ultrasound report and need for follow up ultrasound on 3/28 @ 10am. Patient verbalized understanding and asked about if they saw an ectopic or why there was fluid on her right ovary/tube. Told patient that she is so early right now that we wouldn't be able to see anything anywhere. Told patient they did see some fluid but at this time cannot tell if it's something simple like a cyst or if it's an ectopic. Told patient that by the time she has a repeat ultrasound we should be able to see something and know if this is ectopic or not. Discussed with patient that if she has severe pain again or bleeding to come straight to MAU for evaluation. Patient verbalized understanding to all and had no other questions

## 2014-09-03 ENCOUNTER — Encounter (HOSPITAL_COMMUNITY): Payer: Self-pay | Admitting: *Deleted

## 2014-09-03 ENCOUNTER — Inpatient Hospital Stay (HOSPITAL_COMMUNITY): Payer: Medicare Other

## 2014-09-03 ENCOUNTER — Inpatient Hospital Stay (HOSPITAL_COMMUNITY)
Admission: AD | Admit: 2014-09-03 | Discharge: 2014-09-03 | Disposition: A | Discharge status: Home / Self Care | Payer: Medicare Other | Source: Ambulatory Visit | Attending: Family Medicine | Admitting: Family Medicine

## 2014-09-03 ENCOUNTER — Encounter: Payer: Self-pay | Admitting: Family Medicine

## 2014-09-03 DIAGNOSIS — O26899 Other specified pregnancy related conditions, unspecified trimester: Secondary | ICD-10-CM | POA: Diagnosis not present

## 2014-09-03 DIAGNOSIS — Z3A01 Less than 8 weeks gestation of pregnancy: Secondary | ICD-10-CM

## 2014-09-03 DIAGNOSIS — O24311 Unspecified pre-existing diabetes mellitus in pregnancy, first trimester: Secondary | ICD-10-CM | POA: Diagnosis not present

## 2014-09-03 DIAGNOSIS — Z79899 Other long term (current) drug therapy: Secondary | ICD-10-CM | POA: Insufficient documentation

## 2014-09-03 DIAGNOSIS — R102 Pelvic and perineal pain: Secondary | ICD-10-CM | POA: Diagnosis present

## 2014-09-03 DIAGNOSIS — Z87891 Personal history of nicotine dependence: Secondary | ICD-10-CM | POA: Diagnosis not present

## 2014-09-03 DIAGNOSIS — R300 Dysuria: Secondary | ICD-10-CM | POA: Insufficient documentation

## 2014-09-03 DIAGNOSIS — Z794 Long term (current) use of insulin: Secondary | ICD-10-CM | POA: Diagnosis not present

## 2014-09-03 DIAGNOSIS — E119 Type 2 diabetes mellitus without complications: Secondary | ICD-10-CM | POA: Diagnosis not present

## 2014-09-03 DIAGNOSIS — O9989 Other specified diseases and conditions complicating pregnancy, childbirth and the puerperium: Secondary | ICD-10-CM | POA: Diagnosis not present

## 2014-09-03 DIAGNOSIS — O10911 Unspecified pre-existing hypertension complicating pregnancy, first trimester: Secondary | ICD-10-CM | POA: Insufficient documentation

## 2014-09-03 LAB — URINALYSIS, ROUTINE W REFLEX MICROSCOPIC
Glucose, UA: NEGATIVE mg/dL
Ketones, ur: NEGATIVE mg/dL
Nitrite: NEGATIVE
PROTEIN: NEGATIVE mg/dL
UROBILINOGEN UA: 1 mg/dL (ref 0.0–1.0)
pH: 6 (ref 5.0–8.0)

## 2014-09-03 LAB — URINE MICROSCOPIC-ADD ON

## 2014-09-03 LAB — HCG, QUANTITATIVE, PREGNANCY: hCG, Beta Chain, Quant, S: 2362 m[IU]/mL — ABNORMAL HIGH (ref <5)

## 2014-09-03 NOTE — MAU Note (Signed)
Pt was seen in MAU this past Sunday for same complaint;  Hx of ectopic pregnancies;

## 2014-09-03 NOTE — MAU Provider Note (Signed)
History     CSN: [id number]  Arrival date and time: 09/03/14 [id number]   First Provider Initiated Contact with Patient 09/03/14 1116      Chief Complaint  Patient presents with  . Pelvic Pain   HPI Michelle Duffy 39 y.o. [id number] @[redacted]w[redacted]d  presents to MAU complaining of increased pain since here 2 days ago.  It is located lower mid abdomen more on right.  It is crampy.  She notes dysuria.  She denies nausea vomiting, vag bleeding, discharge, fever.    Past Medical History  Diagnosis Date  . Hypertension   . Diabetes mellitus without complication     Past Surgical History  Procedure Laterality Date  . Revers    . Salpingectomy Left     ruptured ectopic  . Laparoscopy N/A 05/29/2013    Procedure: LAPAROSCOPY OPERATIVE with Right Salpingostomy;  Surgeon: Tilda BurrowJohn V Ferguson, MD;  Location: [facility];  Service: Gynecology;  Laterality: N/A;  . Cesarean section    . Tubal ligation      Family History  Problem Relation Age of Onset  . Alcohol abuse Neg Hx   . Arthritis Neg Hx   . Asthma Neg Hx   . Birth defects Neg Hx   . Cancer Neg Hx   . COPD Neg Hx   . Depression Neg Hx   . Diabetes Neg Hx   . Drug abuse Neg Hx   . Early death Neg Hx   . Hearing loss Neg Hx   . Heart disease Neg Hx   . Hyperlipidemia Neg Hx   . Hypertension Neg Hx   . Kidney disease Neg Hx   . Learning disabilities Neg Hx   . Mental illness Neg Hx   . Mental retardation Neg Hx   . Miscarriages / Stillbirths Neg Hx   . Stroke Neg Hx   . Vision loss Neg Hx   . Varicose Veins Neg Hx     History  Substance Use Topics  . Smoking status: Former Games developermoker  . Smokeless tobacco: Not on file  . Alcohol Use: No    Allergies: No Known Allergies  Prescriptions prior to admission  Medication Sig Dispense Refill Last Dose  . acetaminophen (TYLENOL) 500 MG tablet Take 1,000 mg by mouth every 8 (eight) hours as needed for mild pain or moderate pain.   09/03/2014 at Unknown time  . HYDROcodone-acetaminophen  (NORCO/VICODIN) 5-325 MG per tablet Take 1 tablet by mouth every 6 (six) hours as needed. 20 tablet 0 09/03/2014 at Unknown time  . insulin NPH-regular Human (NOVOLIN 70/30) (70-30) 100 UNIT/ML injection Inject 22 Units into the skin 2 (two) times daily with a meal.    09/03/2014 at Unknown time  . lubiprostone (AMITIZA) 24 MCG capsule Take 24 mcg by mouth 2 (two) times daily with a meal.   09/02/2014 at Unknown time  . metoprolol tartrate (LOPRESSOR) 25 MG tablet Take 25 mg by mouth 2 (two) times daily.   09/03/2014 at Unknown time  . HYDROcodone-acetaminophen (NORCO/VICODIN) 5-325 MG per tablet Take 1 tablet by mouth every 6 (six) hours as needed. (Patient not taking: Reported on 08/30/2014) 20 tablet 0 Not Taking    ROS Pertinent ROS in HPI  Physical Exam   Blood pressure 124/76, pulse 110, temperature 98.6 F (37 C), temperature source Oral, resp. rate 18, last menstrual period 07/30/2014, unknown if currently breastfeeding.  Physical Exam  Constitutional: She is oriented to person, place, and time. She appears well-developed and well-nourished. No distress.  HENT:  Head: Normocephalic and atraumatic.  Eyes: EOM are normal.  Neck: Normal range of motion.  Cardiovascular: Normal rate and regular rhythm.   Respiratory: Effort normal and breath sounds normal. No respiratory distress.  GI: Soft. Bowel sounds are normal. She exhibits no distension. There is no tenderness. There is no rebound and no guarding.  Musculoskeletal: Normal range of motion.  Neurological: She is alert and oriented to person, place, and time.  Skin: Skin is warm and dry.  Psychiatric: She has a normal mood and affect.    US Ob Transvaginal  09/03/2014   CLINICAL DATA:  History of ectopic pregnancy. Pelvic pain. Positive pregnancy test. Beta HCG 1836 2 days ago, 646 4 days ago, 261 6 days ago.  EXAM: TRANSVAGINAL OB ULTRASOUND  TECHNIQUE: Transvaginal ultrasound was performed for complete evaluation of the gestation as  well as the maternal uterus, adnexal regions, and pelvic cul-de-sac.  COMPARISON:  Ultrasound 08/30/2014.  FINDINGS: Intrauterine gestational sac: Not visualized.  Yolk sac:  Not visualized.  Embryo:  Not visualized.  Maternal uterus/adnexae: No evidence of focal uterine or ovarian abnormality. Tiny physiologic cyst right ovary. Prior left salpingectomy. Left ovary not visualized. No prominent free pelvic fluid.  IMPRESSION: No intrauterine pregnancy identified. Given serial beta HCG findings and the patient's history of multiple ectopic pregnancies and pain these findings are suspicious for the presence of an ectopic pregnancy. No ectopic pregnancy is identified sonographically. Continued follow-up pelvic ultrasounds can be obtained as needed. Serial beta-hCGs should be considered. Early IUP or missed abortion could also present with a normal appearing pelvic ultrasound. These results will be called to the ordering clinician or representative by the Radiologist Assistant, and communication documented in the PACS or zVision Dashboard.   Electronically Signed   By: Maisie Fus  Register   On: 09/03/2014 12:31   Results for orders placed or performed during the hospital encounter of 09/03/14 (from the past 24 hour(s))  Urinalysis, Routine w reflex microscopic     Status: Abnormal   Collection Time: 09/03/14 10:15 AM  Result Value Ref Range   Color, Urine YELLOW YELLOW   APPearance CLOUDY (A) CLEAR   Specific Gravity, Urine >1.030 (H) 1.005 - 1.030   pH 6.0 5.0 - 8.0   Glucose, UA NEGATIVE NEGATIVE mg/dL   Hgb urine dipstick MODERATE (A) NEGATIVE   Bilirubin Urine SMALL (A) NEGATIVE   Ketones, ur NEGATIVE NEGATIVE mg/dL   Protein, ur NEGATIVE NEGATIVE mg/dL   Urobilinogen, UA 1.0 0.0 - 1.0 mg/dL   Nitrite NEGATIVE NEGATIVE   Leukocytes, UA TRACE (A) NEGATIVE  Urine microscopic-add on     Status: Abnormal   Collection Time: 09/03/14 10:15 AM  Result Value Ref Range   Squamous Epithelial / LPF FEW (A)  RARE   WBC, UA 0-2 <3 WBC/hpf   Bacteria, UA FEW (A) RARE   Urine-Other TRICHOMONAS PRESENT     MAU Course  Procedures  MDM Pt notes pain is completely improved following u/s despite no treatment provided.   U/S cannot identify location of pregnancy.   Pt's HCG unavailable due to machine malfunction and pt needing to leave.   Reviewed with Dr. Shawnie Pons.  Will allow for discharge of pt with strict ectopic precautions and pt advised strongly must return in 48 hours for HCG.    Assessment and Plan  A:  1. Pelvic pain in pregnancy    Pt is post reversal of BTL and has had multiple ectopic pregnancies  P: Discharge to home Return  asap for increased pain/any vaginal bleeding pnv qd Return to MAU in 48 hours for repeat quant.    Bertram Denver 09/03/2014, 11:17 AM

## 2014-09-03 NOTE — MAU Note (Signed)
C/o cramping for past 30 minutes; denies any spotting and vaginal leakage;

## 2014-09-03 NOTE — Discharge Instructions (Signed)
°Ectopic Pregnancy °An ectopic pregnancy is when the fertilized egg attaches (implants) outside the uterus. Most ectopic pregnancies occur in the fallopian tube. Rarely do ectopic pregnancies occur on the ovary, intestine, pelvis, or cervix. In an ectopic pregnancy, the fertilized egg does not have the ability to develop into a normal, healthy baby.  °A ruptured ectopic pregnancy is one in which the fallopian tube gets torn or bursts and results in internal bleeding. Often there is intense abdominal pain, and sometimes, vaginal bleeding. Having an ectopic pregnancy can be life threatening. If left untreated, this dangerous condition can lead to a blood transfusion, abdominal surgery, or even death. °CAUSES  °Damage to the fallopian tubes is the suspected cause in most ectopic pregnancies.  °RISK FACTORS °Depending on your circumstances, the risk of having an ectopic pregnancy will vary. The level of risk can be divided into three categories. °High Risk °· You have gone through infertility treatment. °· You have had a previous ectopic pregnancy. °· You have had previous tubal surgery. °· You have had previous surgery to have the fallopian tubes tied (tubal ligation). °· You have tubal problems or diseases. °· You have been exposed to DES. DES is a medicine that was used until 1971 and had effects on babies whose mothers took the medicine. °· You become pregnant while using an intrauterine device (IUD) for birth control.  °Moderate Risk °· You have a history of infertility. °· You have a history of a sexually transmitted infection (STI). °· You have a history of pelvic inflammatory disease (PID). °· You have scarring from endometriosis. °· You have multiple sexual partners. °· You smoke.  °Low Risk °· You have had previous pelvic surgery. °· You use vaginal douching. °· You became sexually active before 39 years of age. °SIGNS AND SYMPTOMS  °An ectopic pregnancy should be suspected in anyone who has missed a period  and has abdominal pain or bleeding. °· You may experience normal pregnancy symptoms, such as: °¨ Nausea. °¨ Tiredness. °¨ Breast tenderness. °· Other symptoms may include: °¨ Pain with intercourse. °¨ Irregular vaginal bleeding or spotting. °¨ Cramping or pain on one side or in the lower abdomen. °¨ Fast heartbeat. °¨ Passing out while having a bowel movement. °· Symptoms of a ruptured ectopic pregnancy and internal bleeding may include: °¨ Sudden, severe pain in the abdomen and pelvis. °¨ Dizziness or fainting. °¨ Pain in the shoulder area. °DIAGNOSIS  °Tests that may be performed include: °· A pregnancy test. °· An ultrasound test. °· Testing the specific level of pregnancy hormone in the bloodstream. °· Taking a sample of uterus tissue (dilation and curettage, D&C). °· Surgery to perform a visual exam of the inside of the abdomen using a thin, lighted tube with a tiny camera on the end (laparoscope). °TREATMENT  °An injection of a medicine called methotrexate may be given. This medicine causes the pregnancy tissue to be absorbed. It is given if: °· The diagnosis is made early. °· The fallopian tube has not ruptured. °· You are considered to be a good candidate for the medicine. °Usually, pregnancy hormone blood levels are checked after methotrexate treatment. This is to be sure the medicine is effective. It may take 4-6 weeks for the pregnancy to be absorbed (though most pregnancies will be absorbed by 3 weeks). °Surgical treatment may be needed. A laparoscope may be used to remove the pregnancy tissue. If severe internal bleeding occurs, a cut (incision) may be made in the lower abdomen (laparotomy), and the ectopic   pregnancy is removed. This stops the bleeding. Part of the fallopian tube, or the whole tube, may be removed as well (salpingectomy). After surgery, pregnancy hormone tests may be done to be sure there is no pregnancy tissue left. You may receive a Rho (D) immune globulin shot if you are Rh negative  and the father is Rh positive, or if you do not know the Rh type of the father. This is to prevent problems with any future pregnancy. °SEEK IMMEDIATE MEDICAL CARE IF:  °You have any symptoms of an ectopic pregnancy. This is a medical emergency. °MAKE SURE YOU: °· Understand these instructions. °· Will watch your condition. °· Will get help right away if you are not doing well or get worse. °Document Released: 07/06/2004 Document Revised: 10/13/2013 Document Reviewed: 12/26/2012 °ExitCare® Patient Information ©2015 [employer]. This information is not intended to replace advice given to you by your health care provider. Make sure you discuss any questions you have with your health care provider. ° ° °

## 2014-09-05 ENCOUNTER — Inpatient Hospital Stay (HOSPITAL_COMMUNITY)
Admission: AD | Admit: 2014-09-05 | Discharge: 2014-09-05 | Disposition: A | Discharge status: Home / Self Care | Payer: Medicare Other | Source: Ambulatory Visit | Attending: Obstetrics & Gynecology | Admitting: Obstetrics & Gynecology

## 2014-09-05 DIAGNOSIS — O009 Unspecified ectopic pregnancy without intrauterine pregnancy: Secondary | ICD-10-CM

## 2014-09-05 DIAGNOSIS — Z794 Long term (current) use of insulin: Secondary | ICD-10-CM | POA: Diagnosis not present

## 2014-09-05 DIAGNOSIS — Z87891 Personal history of nicotine dependence: Secondary | ICD-10-CM | POA: Insufficient documentation

## 2014-09-05 DIAGNOSIS — I10 Essential (primary) hypertension: Secondary | ICD-10-CM | POA: Insufficient documentation

## 2014-09-05 DIAGNOSIS — Z9079 Acquired absence of other genital organ(s): Secondary | ICD-10-CM | POA: Diagnosis not present

## 2014-09-05 DIAGNOSIS — E119 Type 2 diabetes mellitus without complications: Secondary | ICD-10-CM | POA: Insufficient documentation

## 2014-09-05 LAB — CREATININE, SERUM
Creatinine, Ser: 0.8 mg/dL (ref 0.50–1.10)
GFR calc Af Amer: >90 mL/min (ref >90)
GFR calc non Af Amer: >90 mL/min (ref >90)

## 2014-09-05 LAB — HCG, QUANTITATIVE, PREGNANCY: hCG, Beta Chain, Quant, S: 2892 m[IU]/mL — ABNORMAL HIGH (ref <5)

## 2014-09-05 LAB — AST: AST: 18 U/L (ref 0–37)

## 2014-09-05 LAB — BUN: BUN: 10 mg/dL (ref 6–23)

## 2014-09-05 MED ORDER — METHOTREXATE INJECTION FOR WOMEN'S HOSPITAL
50.0000 mg/m2 | Freq: Once | INTRAMUSCULAR | Status: AC
  Administered 2014-09-05: 80 mg via INTRAMUSCULAR
  Filled 2014-09-05: qty 1.6

## 2014-09-05 NOTE — Discharge Instructions (Signed)
Ectopic Pregnancy °An ectopic pregnancy happens when a fertilized egg grows outside the uterus. A pregnancy cannot live outside of the uterus. This problem often happens in the fallopian tube. It is often caused by damage to the fallopian tube. °If this problem is found early, you may be treated with medicine. If your tube tears or bursts open (ruptures), you will bleed inside. This is an emergency. You will need surgery. Get help right away.  °SYMPTOMS °You may have normal pregnancy symptoms at first. These include: °· Missing your period. °· Feeling sick to your stomach (nauseous). °· Being tired. °· Having tender breasts. °Then, you may start to have symptoms that are not normal. These include: °· Pain with sex (intercourse). °· Bleeding from the vagina. This includes light bleeding (spotting). °· Belly (abdomen) or lower belly cramping or pain. This may be felt on one side. °· A fast heartbeat (pulse). °· Passing out (fainting) after going poop (bowel movement). °If your tube tears, you may have symptoms such as: °· Really bad pain in the belly or lower belly. This happens suddenly. °· Dizziness. °· Passing out. °· Shoulder pain. °GET HELP RIGHT AWAY IF:  °You have any of these symptoms. This is an emergency. °MAKE SURE YOU: °· Understand these instructions. °· Will watch your condition. °· Will get help right away if you are not doing well or get worse. °Document Released: 08/25/2008 Document Revised: 06/03/2013 Document Reviewed: 01/08/2013 °ExitCare® Patient Information ©2015 [employer]. This information is not intended to replace advice given to you by your health care provider. Make sure you discuss any questions you have with your health care provider. ° °Methotrexate Treatment for an Ectopic Pregnancy, Care After °Refer to this sheet in the next few weeks. These instructions provide you with information on caring for yourself after your procedure. Your health care provider may also give you more  specific instructions. Your treatment has been planned according to current medical practices, but problems sometimes occur. Call your health care provider if you have any problems or questions after your procedure. °WHAT TO EXPECT AFTER THE PROCEDURE °You may have some abdominal cramping, vaginal bleeding, and fatigue in the first few days after taking methotrexate. Some other possible side effects of methotrexate include: °· Nausea. °· Vomiting. °· Diarrhea. °· Mouth sores. °· Swelling or irritation of the lining of your lungs (pneumonitis). °· Liver damage. °· Hair loss. °HOME CARE INSTRUCTIONS  °After you have received the methotrexate medicine, you need to be careful of your activities and watch your condition for several weeks. It may take 1 week before your hormone levels return to normal. °· Keep all follow-up appointments as directed by your health care provider. °· Avoid traveling too far away from your health care provider. °· Do not have sexual intercourse until your health care provider says it is safe to do so. °· You may resume your usual diet. °· Limit strenuous activity. °· Do not take folic acid, prenatal vitamins, or other vitamins that contain folic acid. °· Do not take aspirin, ibuprofen, or naproxen (nonsteroidal anti-inflammatory drugs [NSAIDs]). °· Do not drink alcohol. °SEEK MEDICAL CARE IF:  °· You cannot control your nausea and vomiting. °· You cannot control your diarrhea. °· You have sores in your mouth and want treatment. °· You need pain medicine for your abdominal pain. °· You have a rash. °· You are having a reaction to the medicine. °SEEK IMMEDIATE MEDICAL CARE IF:  °· You have increasing abdominal or pelvic pain. °·   You notice increased bleeding. °· You feel light-headed, or you faint. °· You have shortness of breath. °· Your heart rate increases. °· You have a cough. °· You have chills. °· You have a fever. °Document Released: 05/18/2011 Document Revised: 06/03/2013 Document  Reviewed: 03/17/2013 °ExitCare® Patient Information ©2015 [employer]. This information is not intended to replace advice given to you by your health care provider. Make sure you discuss any questions you have with your health care provider. ° °

## 2014-09-05 NOTE — MAU Note (Signed)
Pt denies pain or bleeding. Here for repeat BHCG

## 2014-09-05 NOTE — MAU Provider Note (Signed)
History     CSN: [id number]  Arrival date and time: 09/05/14 1527   None     Chief Complaint  Patient presents with  . Repeat BHCG    HPI Michelle Duffy is 39 y.o. [id number] [redacted]w[redacted]d weeks presents for follow up BHCG. She had labs drawn and then left without begin triaged.   She has had BHCG drawn in MAU on 3/15--73.4                                                               3/18  261                                                               3/20  646                                                               3/22  1836                                                                 3/24  2362  U/S unable to see IUP, neg for ectopic suspicious for ectopic for no IUP and appropriately rising HCGs/pain    She had BTL 2003 with a reversal in 2009.  Hx of ectopic pregnancy  X2 with removal of the left tube after 2nd ectopic that ruptured in 2011.  Had ectopic in 05/2013 had right ectopic pregnancy with Salpingostomy (Dr Emelda Fear).  I called her when the results were back to report lab.  She denies pain and bleeding.  Hx of pain that prompted visits here 3/22 and 3/24.  States she felt like this was an ectopic.  She reports having had an live ectopic at 14 weeks and wants to treats this one early.  She does not want to loose her tube.  She is returning to MAU for treatment.          Past Medical History  Diagnosis Date  . Hypertension   . Diabetes mellitus without complication     Past Surgical History  Procedure Laterality Date  . Revers    . Salpingectomy Left     ruptured ectopic  . Laparoscopy N/A 05/29/2013    Procedure: LAPAROSCOPY OPERATIVE with Right Salpingostomy;  Surgeon: Tilda Burrow, MD;  Location: [facility];  Service: Gynecology;  Laterality: N/A;  . Cesarean section    . Tubal ligation      Family History  Problem Relation Age of Onset  . Alcohol abuse Neg Hx   . Arthritis Neg Hx   . Asthma Neg Hx   . Birth defects Neg Hx   .  Cancer Neg Hx   . COPD Neg  Hx   . Depression Neg Hx   . Diabetes Neg Hx   . Drug abuse Neg Hx   . Early death Neg Hx   . Hearing loss Neg Hx   . Heart disease Neg Hx   . Hyperlipidemia Neg Hx   . Hypertension Neg Hx   . Kidney disease Neg Hx   . Learning disabilities Neg Hx   . Mental illness Neg Hx   . Mental retardation Neg Hx   . Miscarriages / Stillbirths Neg Hx   . Stroke Neg Hx   . Vision loss Neg Hx   . Varicose Veins Neg Hx     History  Substance Use Topics  . Smoking status: Former Games developermoker  . Smokeless tobacco: Not on file  . Alcohol Use: No    Allergies: No Known Allergies  Prescriptions prior to admission  Medication Sig Dispense Refill Last Dose  . acetaminophen (TYLENOL) 500 MG tablet Take 1,000 mg by mouth every 8 (eight) hours as needed for mild pain or moderate pain.   09/03/2014 at Unknown time  . HYDROcodone-acetaminophen (NORCO/VICODIN) 5-325 MG per tablet Take 1 tablet by mouth every 6 (six) hours as needed. 20 tablet 0 09/03/2014 at Unknown time  . insulin NPH-regular Human (NOVOLIN 70/30) (70-30) 100 UNIT/ML injection Inject 22 Units into the skin 2 (two) times daily with a meal.    09/03/2014 at Unknown time  . lubiprostone (AMITIZA) 24 MCG capsule Take 24 mcg by mouth 2 (two) times daily with a meal.   09/02/2014 at Unknown time  . metoprolol tartrate (LOPRESSOR) 25 MG tablet Take 25 mg by mouth 2 (two) times daily.   09/03/2014 at Unknown time    Review of Systems  Constitutional: Negative for fever and chills.  Gastrointestinal: Negative for nausea, vomiting and abdominal pain.  Genitourinary: Negative for dysuria, urgency and frequency.       Neg for vaginal bleeding  Neurological: Negative for headaches.   Physical Exam   Blood pressure 122/74, pulse 107, temperature 99 F (37.2 C), temperature source Oral, resp. rate 16, height 5' 3.5" (1.613 m), weight 126 lb 2 oz (57.21 kg), last menstrual period 07/30/2014, not currently breastfeeding.  Physical Exam  Constitutional:  She appears well-developed and well-nourished.  tearful  HENT:  Head: Normocephalic.    B+ blood type Results for orders placed or performed during the hospital encounter of 09/05/14 (from the past 24 hour(s))  hCG, quantitative, pregnancy     Status: Abnormal   Collection Time: 09/05/14  3:35 PM  Result Value Ref Range   hCG, Beta Chain, Quant, S 2892 (H) <5 mIU/mL  AST     Status: None   Collection Time: 09/05/14  3:35 PM  Result Value Ref Range   AST 18 0 - 37 U/L  BUN     Status: None   Collection Time: 09/05/14  3:35 PM  Result Value Ref Range   BUN 10 6 - 23 mg/dL  Creatinine, serum     Status: None   Collection Time: 09/05/14  3:35 PM  Result Value Ref Range   Creatinine, Ser 0.80 0.50 - 1.10 mg/dL   GFR calc non Af Amer >90 >90 mL/min   GFR calc Af Amer >90 >90 mL/min   MAU Course  Procedures   Labs are normal.  MTX administered in MAU  MDM Consulted with Dr. Melissa NoonHarraway-Smith-U/S not needed b/c U/S done 2 days ago.  Treat  With MTX.   Patient states she wants to be treated with MTX because" I do not want to loose my tube".  Explained to the patient the importance for returning for follow ups as directed for continued evaluation of pain/bleeding and decreasing BHCG to reduce need for surgery but I cannot guarantee surgery intervention will not be needed.  She agrees to follow up per protocol.  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     Patient education given to patient     Return 3/29 for BHCG.  Or sooner for return of pain or bleeding--she agrees  KEY,EVE M 09/05/2014, 7:28 PM

## 2014-09-07 ENCOUNTER — Ambulatory Visit (HOSPITAL_COMMUNITY): Admission: RE | Admit: 2014-09-07 | Payer: Medicare Other | Source: Ambulatory Visit

## 2014-09-08 ENCOUNTER — Inpatient Hospital Stay (HOSPITAL_COMMUNITY)
Admission: AD | Admit: 2014-09-08 | Discharge: 2014-09-08 | Disposition: A | Discharge status: Home / Self Care | Payer: Medicare Other | Source: Ambulatory Visit | Attending: Family Medicine | Admitting: Family Medicine

## 2014-09-08 DIAGNOSIS — O009 Unspecified ectopic pregnancy without intrauterine pregnancy: Secondary | ICD-10-CM

## 2014-09-08 LAB — HCG, QUANTITATIVE, PREGNANCY: hCG, Beta Chain, Quant, S: 4049 m[IU]/mL — ABNORMAL HIGH (ref <5)

## 2014-09-08 NOTE — MAU Note (Signed)
Here for repeat BHCG, day 4 post MTX

## 2014-09-08 NOTE — MAU Note (Signed)
No pain today. Light bleeding and is wearing a pad.

## 2014-09-08 NOTE — Discharge Instructions (Signed)
Methotrexate Treatment for an Ectopic Pregnancy, Care After °Refer to this sheet in the next few weeks. These instructions provide you with information on caring for yourself after your procedure. Your health care provider may also give you more specific instructions. Your treatment has been planned according to current medical practices, but problems sometimes occur. Call your health care provider if you have any problems or questions after your procedure. °WHAT TO EXPECT AFTER THE PROCEDURE °You may have some abdominal cramping, vaginal bleeding, and fatigue in the first few days after taking methotrexate. Some other possible side effects of methotrexate include: °· Nausea. °· Vomiting. °· Diarrhea. °· Mouth sores. °· Swelling or irritation of the lining of your lungs (pneumonitis). °· Liver damage. °· Hair loss. °HOME CARE INSTRUCTIONS  °After you have received the methotrexate medicine, you need to be careful of your activities and watch your condition for several weeks. It may take 1 week before your hormone levels return to normal. °· Keep all follow-up appointments as directed by your health care provider. °· Avoid traveling too far away from your health care provider. °· Do not have sexual intercourse until your health care provider says it is safe to do so. °· You may resume your usual diet. °· Limit strenuous activity. °· Do not take folic acid, prenatal vitamins, or other vitamins that contain folic acid. °· Do not take aspirin, ibuprofen, or naproxen (nonsteroidal anti-inflammatory drugs [NSAIDs]). °· Do not drink alcohol. °SEEK MEDICAL CARE IF:  °· You cannot control your nausea and vomiting. °· You cannot control your diarrhea. °· You have sores in your mouth and want treatment. °· You need pain medicine for your abdominal pain. °· You have a rash. °· You are having a reaction to the medicine. °SEEK IMMEDIATE MEDICAL CARE IF:  °· You have increasing abdominal or pelvic pain. °· You notice increased  bleeding. °· You feel light-headed, or you faint. °· You have shortness of breath. °· Your heart rate increases. °· You have a cough. °· You have chills. °· You have a fever. °Document Released: 05/18/2011 Document Revised: 06/03/2013 Document Reviewed: 03/17/2013 °[employer]® Patient Information ©2015 [employer]. This information is not intended to replace advice given to you by your health care provider. Make sure you discuss any questions you have with your health care provider. ° °

## 2014-09-08 NOTE — MAU Note (Signed)
Had some pain this morning- not that bad, not a stabbing pain- took some tylenol and is gone now, was worse on Sunday. Scant blood noted this morning when  Wiped, scared her as she wasn't expecting it.

## 2014-09-08 NOTE — MAU Provider Note (Signed)
Ms. Michelle Duffy  is a 39 y.o. [id number]  at 866w5d who presents to MAU today for follow-up quant hCG. The patient states mild abdominal cramping and spotting noted with wiping. She denies fever.   BP 119/78 mmHg  Pulse 105  Temp(Src) 98.6 F (37 C) (Oral)  Ht 5' 3.5" (1.613 m)  Wt 128 lb 2 oz (58.117 kg)  BMI 22.34 kg/m2  SpO2 100%  LMP 07/30/2014  GENERAL: Well-developed, well-nourished female in no acute distress.  HEENT: Normocephalic, atraumatic.   LUNGS: Effort normal HEART: Regular rate  SKIN: Warm, dry and without erythema PSYCH: Normal mood and affect  MDM Discussed with Dr. Adrian BlackwaterStinson. Ok with discharge at this time and follow-up on day #7 for repeat labs or sooner PRN  A: Ectopic pregnancy s/p MTX  P: Discharge home Bleeding/Ectopic precautions discussed Follow-up day #7 for repeat labs or sooner if symptoms worsen Patient may return to MAU as needed or if her condition were to change or worsen   Marny LowensteinJulie N Wenzel, PA-C  09/08/2014 10:38 AM

## 2014-09-11 ENCOUNTER — Telehealth: Payer: Self-pay | Admitting: Advanced Practice Midwife

## 2014-09-11 ENCOUNTER — Inpatient Hospital Stay (HOSPITAL_COMMUNITY)
Admission: AD | Admit: 2014-09-11 | Discharge: 2014-09-11 | Disposition: A | Discharge status: Home / Self Care | Payer: Medicare Other | Source: Ambulatory Visit | Attending: Obstetrics & Gynecology | Admitting: Obstetrics & Gynecology

## 2014-09-11 DIAGNOSIS — O009 Ectopic pregnancy, unspecified: Secondary | ICD-10-CM | POA: Diagnosis present

## 2014-09-11 LAB — HCG, QUANTITATIVE, PREGNANCY: HCG, BETA CHAIN, QUANT, S: 1943 m[IU]/mL — AB (ref <5)

## 2014-09-11 NOTE — MAU Note (Signed)
Pt presents to MAU for repeat BHCG. Denies any pain or bleeding 

## 2014-09-11 NOTE — MAU Provider Note (Signed)
S: 39 y.o. [id number] on Day 7 following MTX therapy for ectopic pregnancy presents to MAU for repeat hcg.  She denies abdominal pain or vaginal bleeding today.    Her quant hcg on 09/08/14, which was Day 4 following therapy, was 4049.  Initial U/S on 3/26 with no evidence of IUP or ectopic.  O: BP 132/71 mmHg  Pulse 104  Temp(Src) 98.7 F (37.1 C)  Resp 18  LMP 07/30/2014  Physical Examination: General appearance - alert, well appearing, and in no distress, oriented to person, place, and time and acyanotic, in no respiratory distress  Results for orders placed or performed during the hospital encounter of 09/11/14 (from the past 168 hour(s))  hCG, quantitative, pregnancy   Collection Time: 09/11/14  9:15 AM  Result Value Ref Range   hCG, Beta Chain, Quant, S 1943 (H) <5 mIU/mL   Blood type B positive   A: Ectopic pregnancy Appropriate drop in hcg following MTX therapy  P: D/C home with ectopic/bleeding precautions F/U in clinic in 1 week Return to MAU as needed for emergencies  LEFTWICH-KIRBY, Eulises Kijowski, CNM 2:18 PM

## 2014-09-11 NOTE — Telephone Encounter (Signed)
Pt had to leave MAU prior to quant hcg results today.  She presented to MAU today for Day 7 quant hcg following MTX therapy for ectopic pregnancy.  Quant hcg down by 50% today from Day 4 labs.  Notified pt of appropriate drop in hcg.  Follow up quant hcg in 1 week in [facility], appt made.

## 2014-09-18 ENCOUNTER — Telehealth: Payer: Self-pay | Admitting: General Practice

## 2014-09-18 ENCOUNTER — Other Ambulatory Visit: Payer: Medicare Other

## 2014-09-18 DIAGNOSIS — O009 Unspecified ectopic pregnancy without intrauterine pregnancy: Secondary | ICD-10-CM

## 2014-09-18 LAB — HCG, QUANTITATIVE, PREGNANCY: hCG, Beta Chain, Quant, S: 397 m[IU]/mL

## 2014-09-18 NOTE — Telephone Encounter (Signed)
Called patient and informed her of plan of care. Patient verbalized understanding and asked about appt for birth control so this doesn't happen again. Told patient I will let our front office know and they will reach her with an appt for birth control. Patient verbalized understanding and knows the 4/25 appt will be cancelled. Patient had no questions

## 2014-09-18 NOTE — Telephone Encounter (Signed)
-----   Message from Levie HeritageJacob J Stinson, DO sent at 09/18/2014 11:31 AM EDT ----- Sufficient drop.  No need for further testing unless she does not have a menses in 6-8 weeks.

## 2014-09-18 NOTE — Progress Notes (Unsigned)
Consulted with Dr Adrian BlackwaterStinson, order bhcg stat. Patient reports no changes, still having slight RLQ pain but with some improvement. Report no bleeding at this time. Patient aware we will contact if change in care is needed.

## 2014-10-02 ENCOUNTER — Ambulatory Visit: Payer: Medicare Other | Admitting: Obstetrics & Gynecology

## 2014-10-05 ENCOUNTER — Encounter: Payer: Medicare Other | Admitting: Family Medicine

## 2014-10-06 ENCOUNTER — Ambulatory Visit: Payer: Medicare Other | Admitting: Obstetrics and Gynecology

## 2015-06-16 IMAGING — US US OB TRANSVAGINAL
1 series · 14 of 25 positions shown · non-contrast
Comparison: 05/28/2013

CLINICAL DATA: First trimester pregnancy with uncertain fetal
viability. Unsure of LMP. Previous ectopic pregnancy.

EXAM:
OBSTETRIC <14 WK US AND TRANSVAGINAL OB US
TECHNIQUE: Both transabdominal and transvaginal ultrasound examinations were
performed for complete evaluation of the gestation as well as the
maternal uterus, adnexal regions, and pelvic cul-de-sac.
Transvaginal technique was performed to assess early pregnancy.

[Series 1: us ob transvaginal · 14 of 25 slices shown]
[im 1/25]
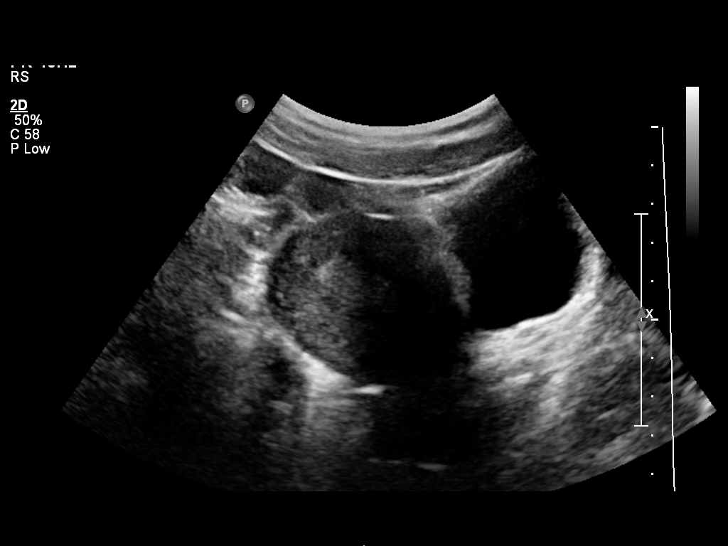
[im 3/25]
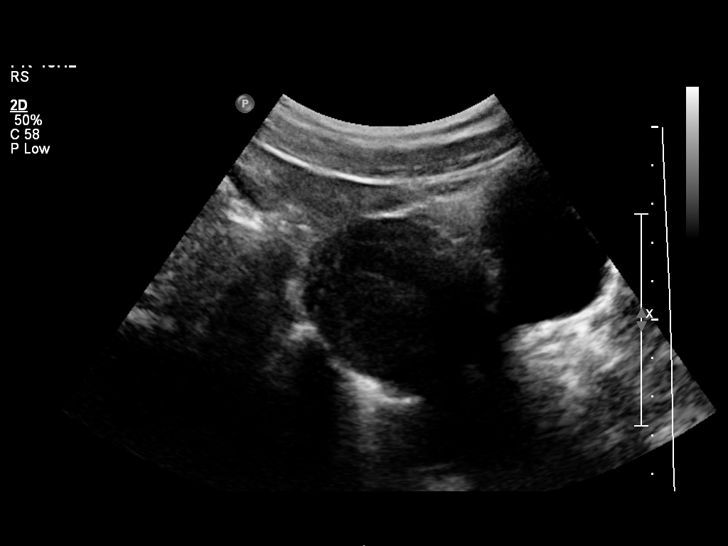
[im 5/25]
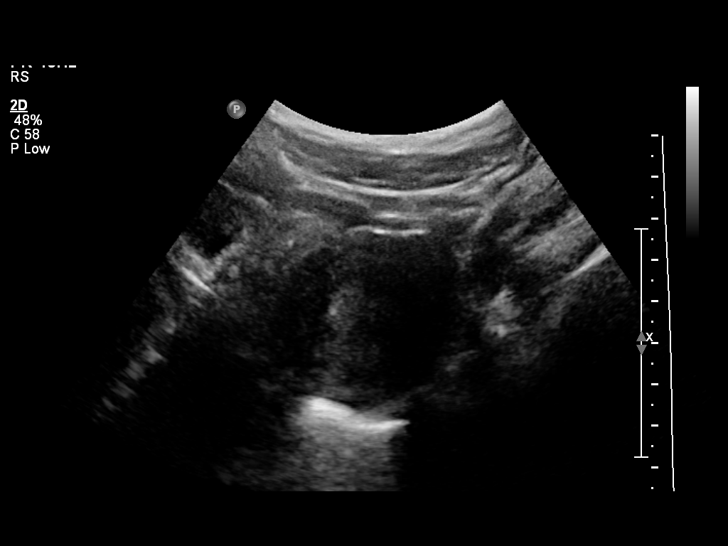
[im 7/25]
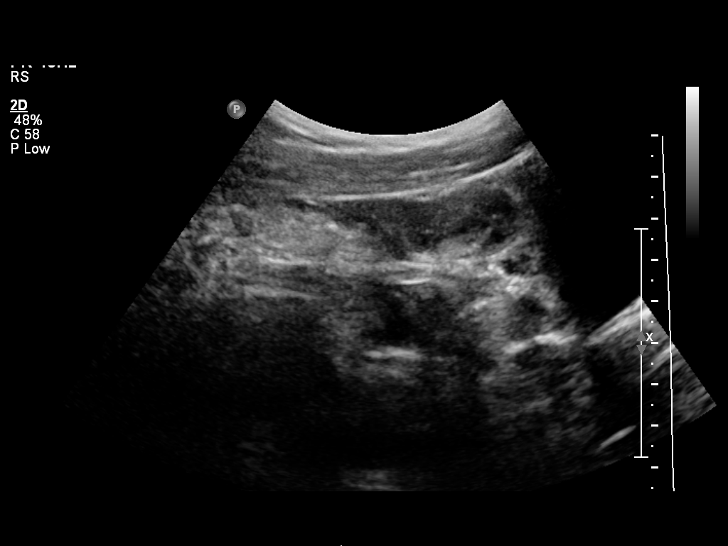
[im 9/25]
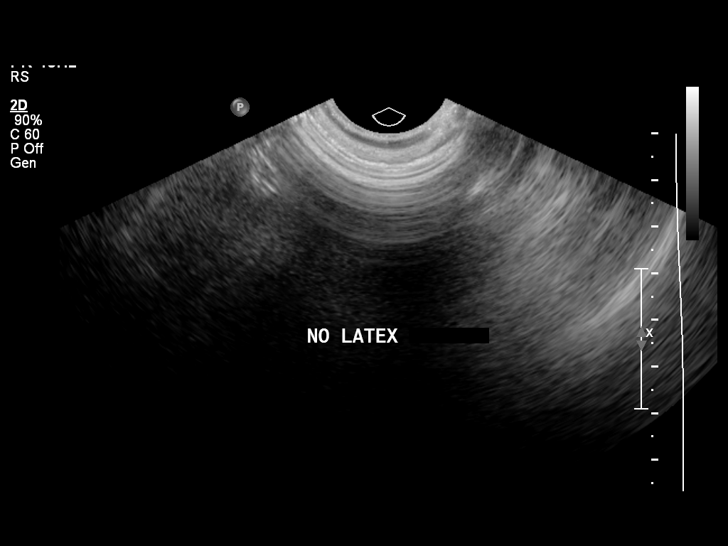
[im 10/25]
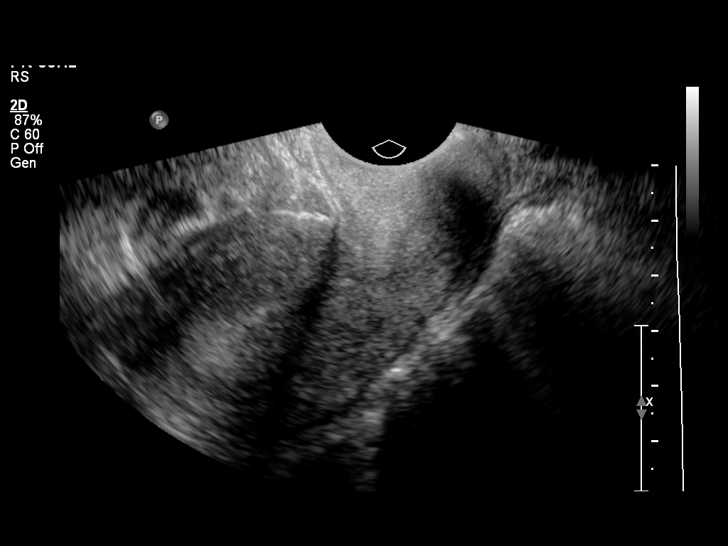
[im 12/25]
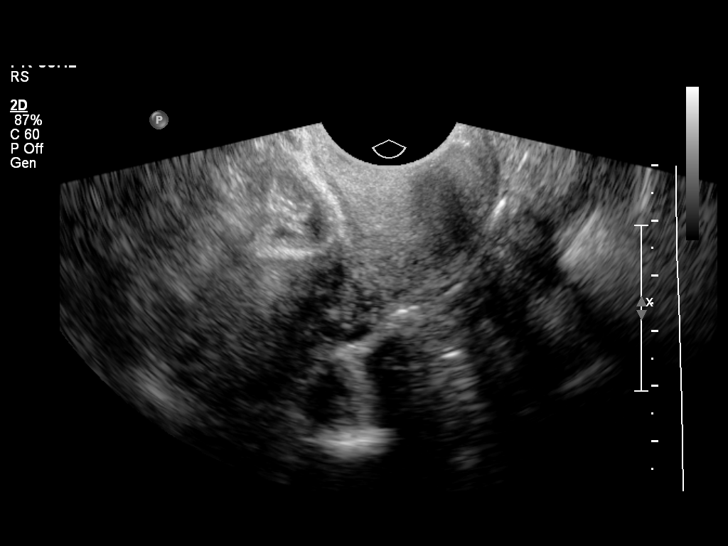
[im 14/25]
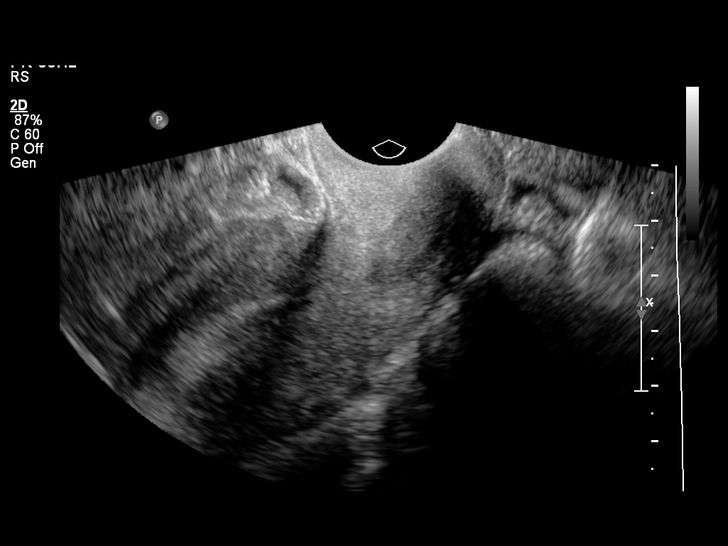
[im 16/25]
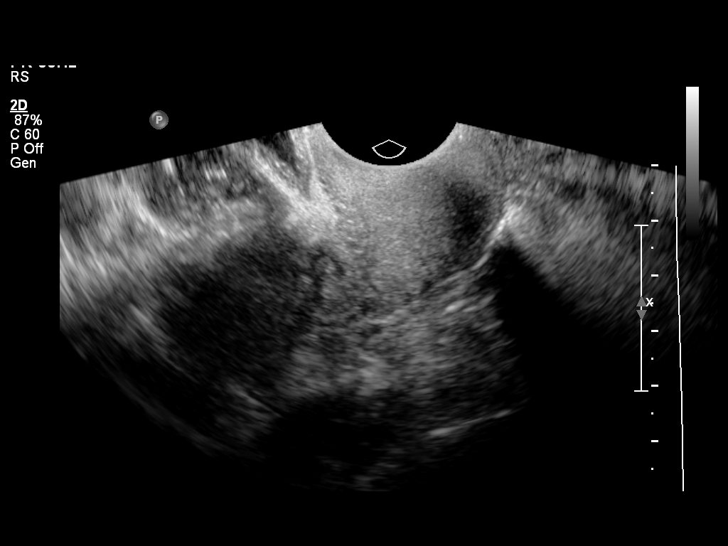
[im 17/25]
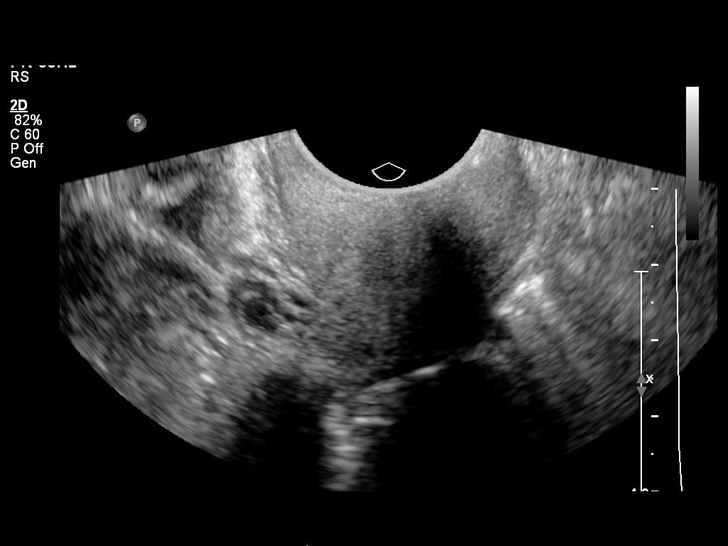
[im 19/25]
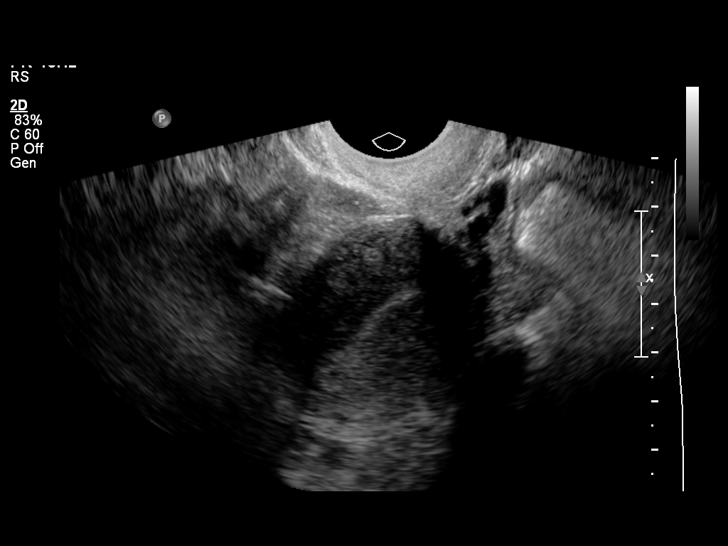
[im 21/25]
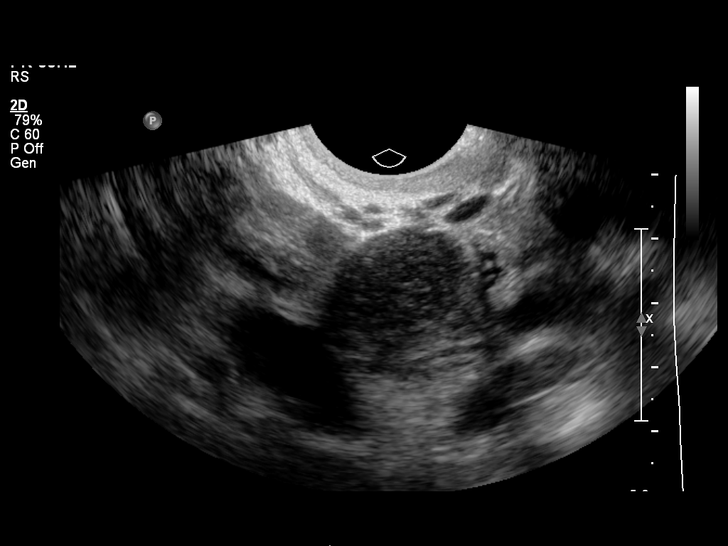
[im 23/25]
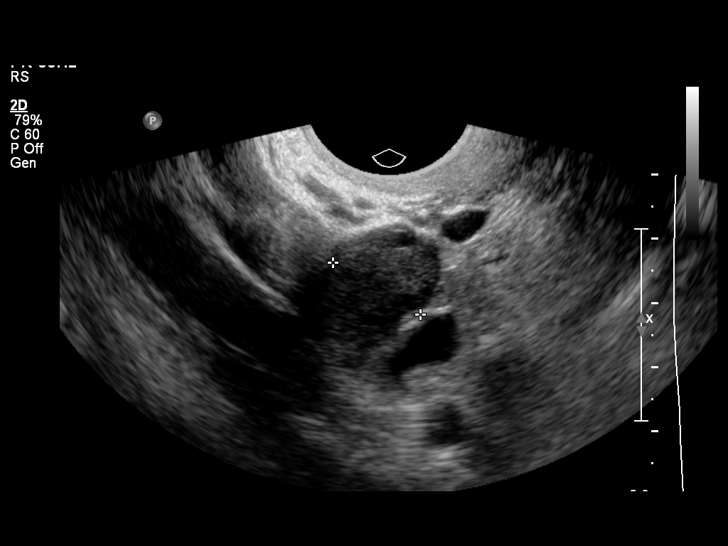
[im 25/25]
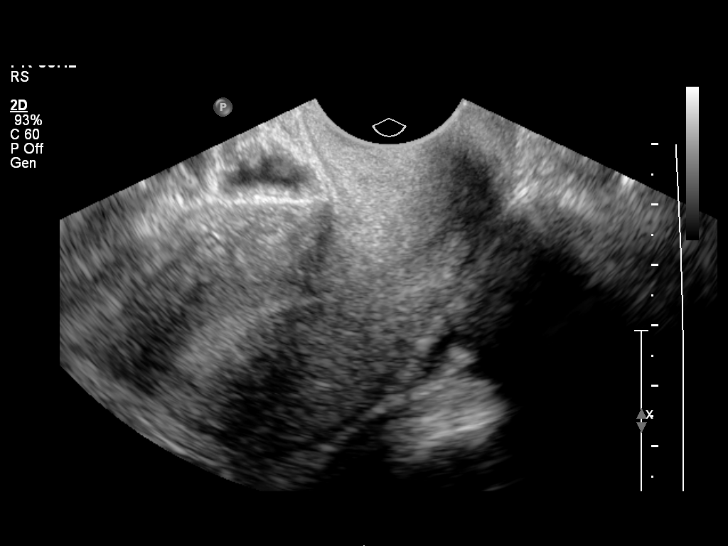

[14 of 25 positions shown; findings below may reference images not displayed]

FINDINGS: No intrauterine gestational sac or other fluid collection visualized
in endometrial cavity. No fibroids identified.

The right ovary is normal in appearance. Left ovary is not directly
visualized by transabdominal or transvaginal sonography, however no
adnexal mass identified. No evidence of free fluid.
IMPRESSION: Pregnancy location not visualized sonographically. Differential
diagnosis includes recent spontaneous abortion, IUP too early to
visualize, and occult ectopic pregnancy. Recommend close follow up
of quantitative B-HCG levels, and follow up US as clinically
warranted.

## 2015-06-18 IMAGING — US US OB TRANSVAGINAL
1 series · 13 of 21 positions shown · non-contrast
Comparison: None.

CLINICAL DATA: Acute onset of right lower quadrant abdominal pain.
Initial encounter.

EXAM:
TRANSVAGINAL OB ULTRASOUND
TECHNIQUE: Transvaginal ultrasound was performed for complete evaluation of the
gestation as well as the maternal uterus, adnexal regions, and
pelvic cul-de-sac.

[Series 1: us ob transvaginal · 13 of 21 slices shown]
[im 1/21]
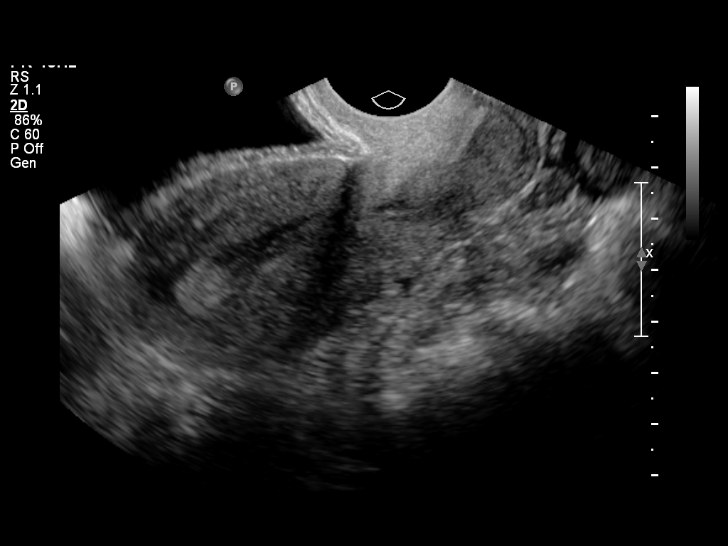
[im 3/21]
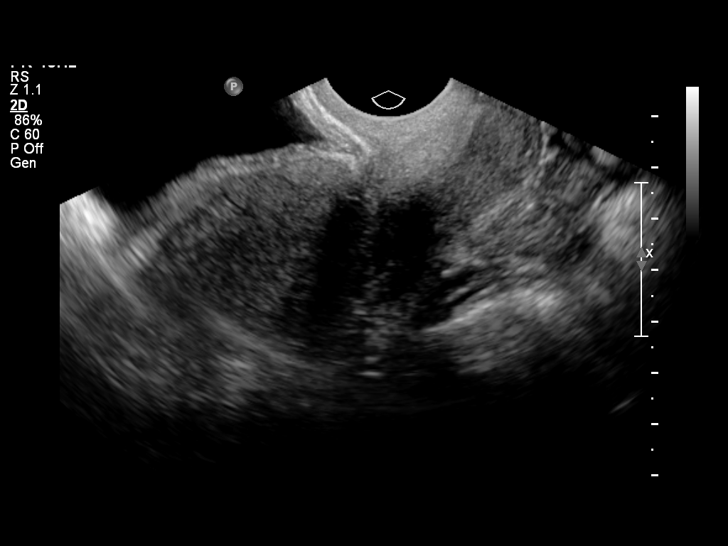
[im 5/21]
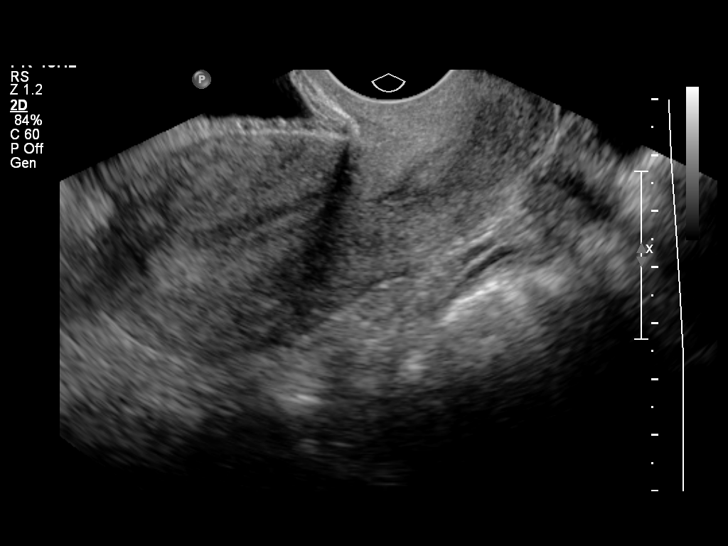
[im 6/21]
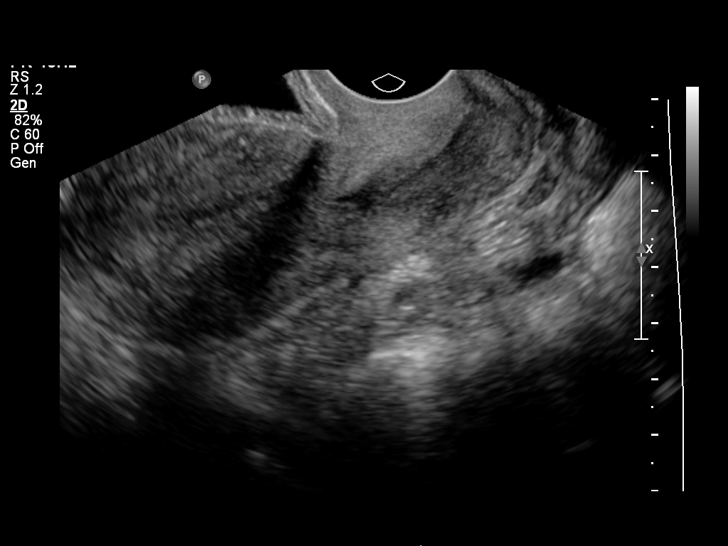
[im 8/21]
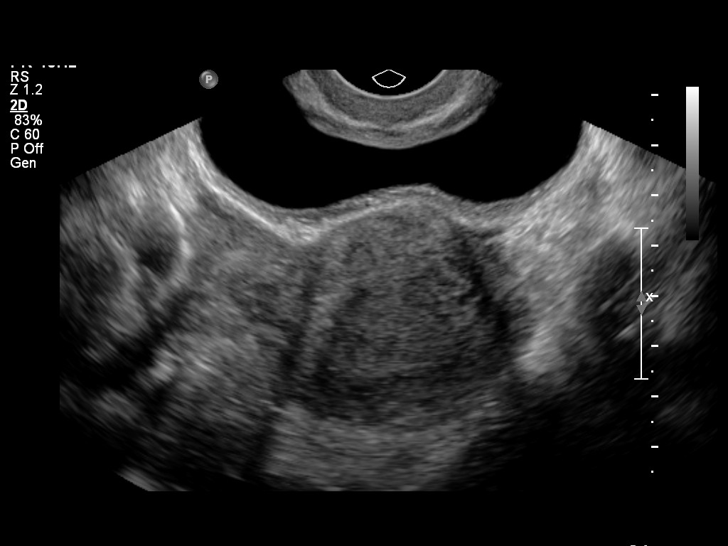
[im 9/21]
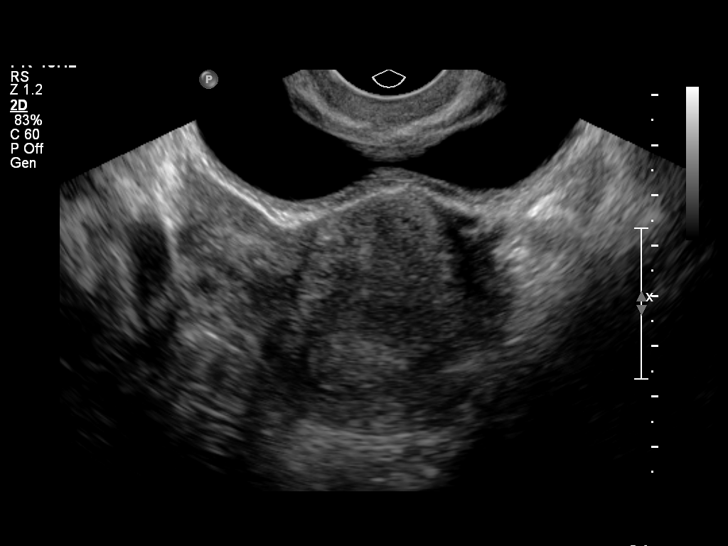
[im 11/21]
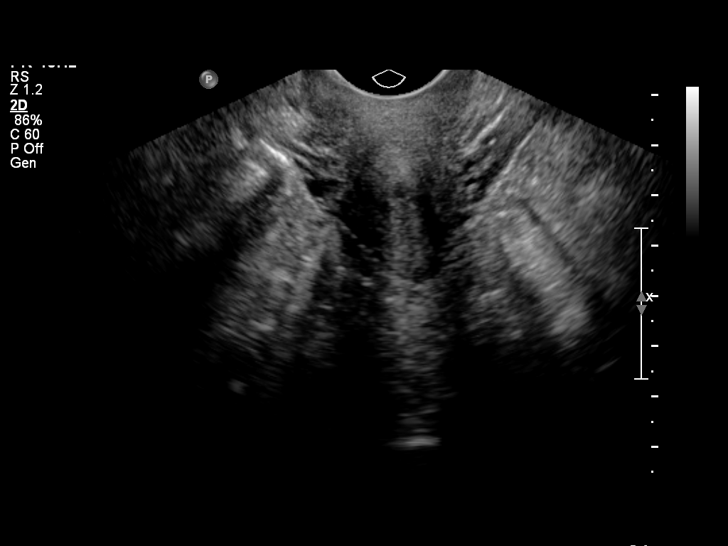
[im 13/21]
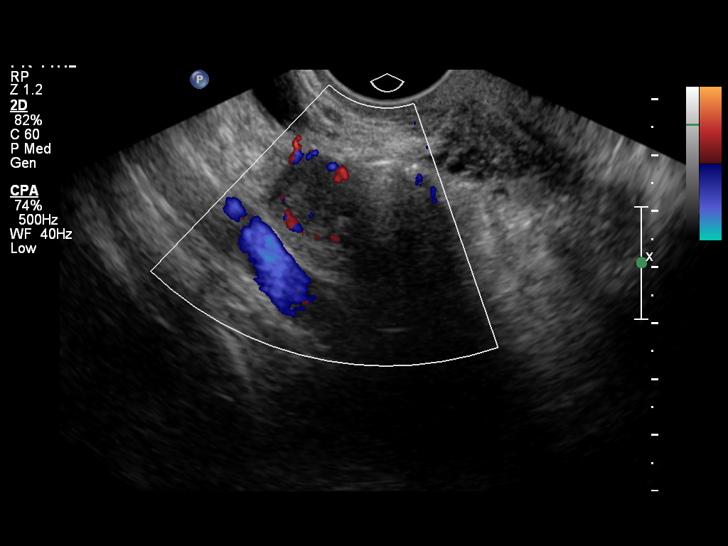
[im 14/21]
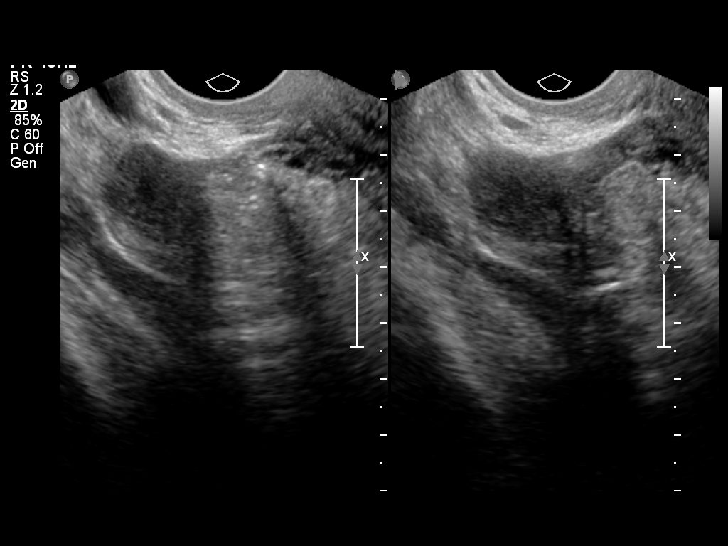
[im 16/21]
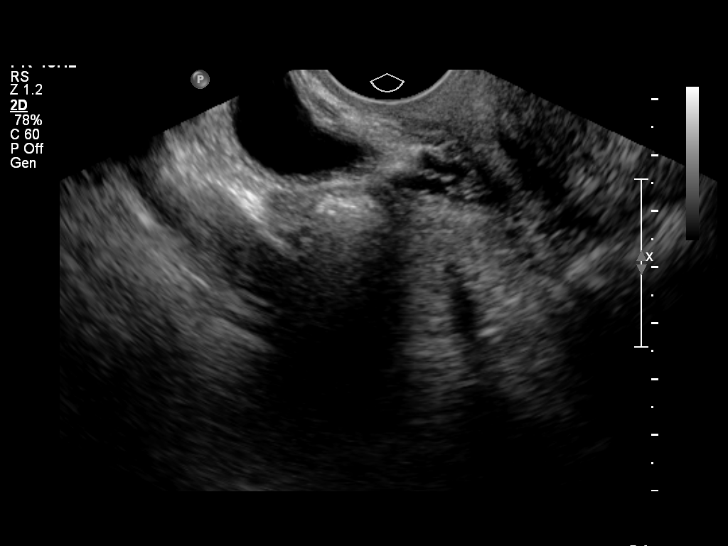
[im 17/21]
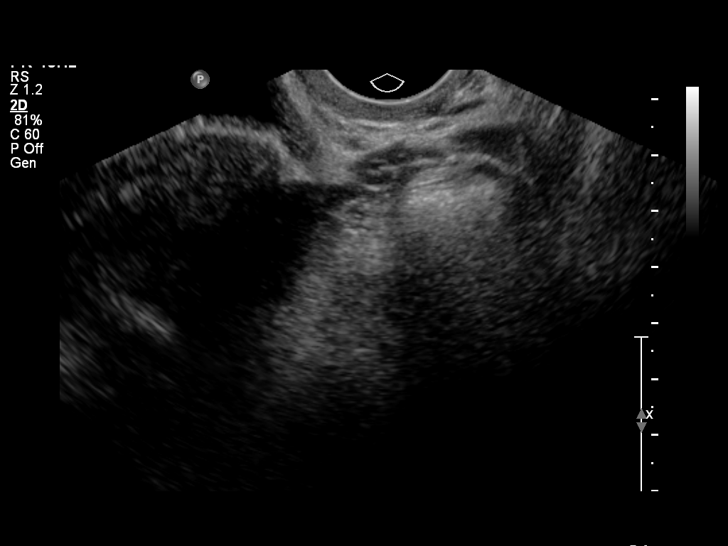
[im 19/21]
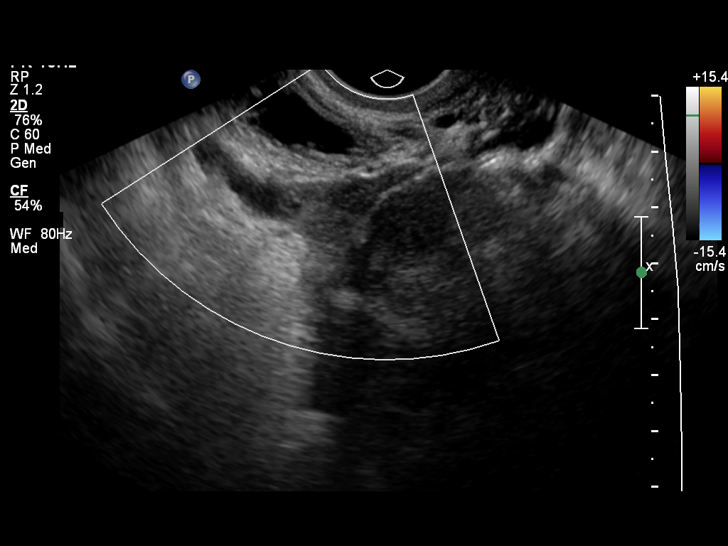
[im 21/21]
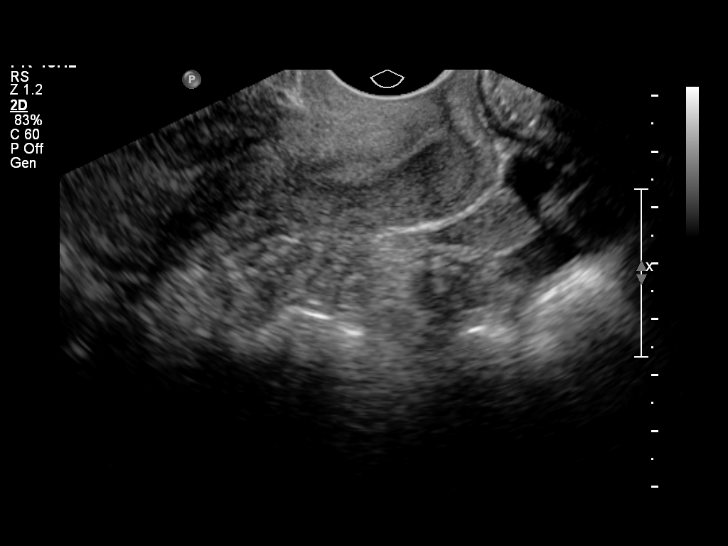

[13 of 21 positions shown; findings below may reference images not displayed]

FINDINGS: Intrauterine gestational sac: None seen.

Yolk sac:  N/A

Embryo:  N/A

Maternal uterus/adnexae: The right ovary is unremarkable in
appearance. It measures 2.6 x 1.9 x 1.7 cm. The left ovary is not
visualized on this study. There is no evidence for ovarian torsion.

The patient had severe right adnexal pain, and there is a small
amount of complex fluid noted at the right adnexa.
IMPRESSION: 1. No intrauterine gestational sac seen at this time. No definite
ectopic pregnancy seen. Given the quantitative beta HCG level of
646, this remains within normal limits. Would follow-up quantitative
beta HCG levels closely, and perform follow-up pelvic ultrasound
within 1 week, at which time an embryo would likely be visible (or
when lack of an intrauterine embryo would be more suggestive of
ectopic pregnancy).
2. Severe right adnexal pain, with small amount of complex fluid at
the right adnexa. If the patient's symptoms persist, this would
raise some degree of concern for ectopic pregnancy.

## 2015-06-22 IMAGING — US US OB TRANSVAGINAL
1 series · 13 of 24 positions shown · non-contrast
Comparison: Ultrasound 08/30/2014.

CLINICAL DATA: History of ectopic pregnancy. Pelvic pain. Positive
pregnancy test. Beta HCG [AGE] ago, [AGE] ago, [AGE]
ago.

EXAM:
TRANSVAGINAL OB ULTRASOUND
TECHNIQUE: Transvaginal ultrasound was performed for complete evaluation of the
gestation as well as the maternal uterus, adnexal regions, and
pelvic cul-de-sac.

[Series 1: us ob transvaginal · 24 acquisitions, 13 frames shown]
[im 1/24]
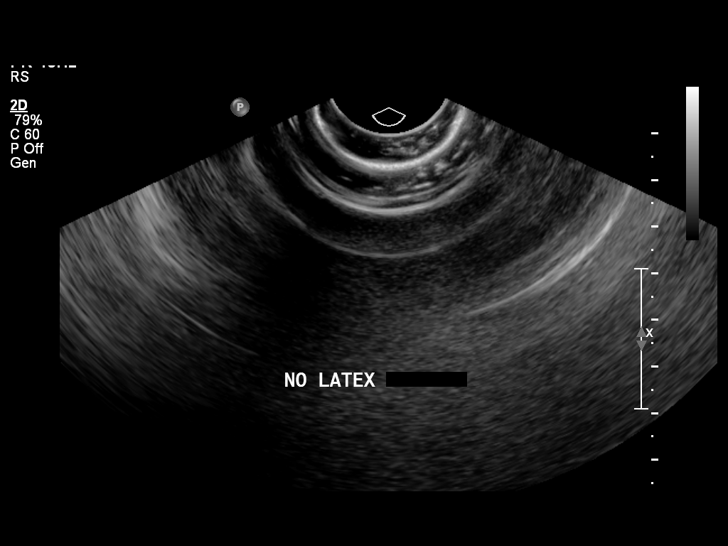
[im 3/24]
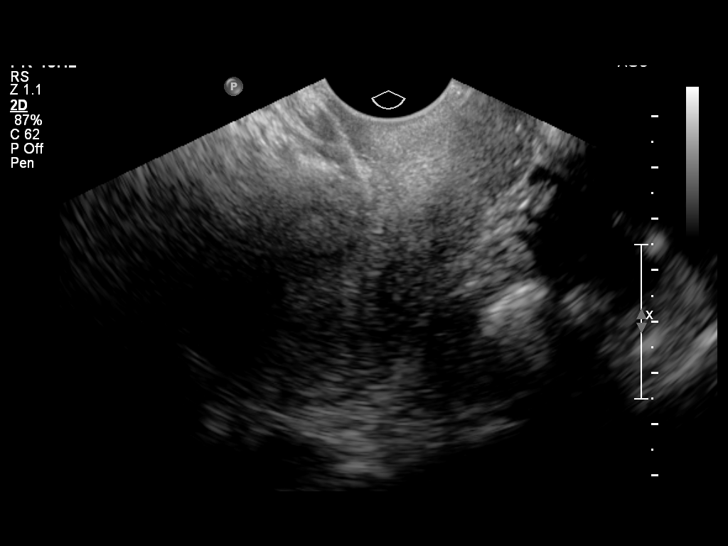
[im 5/24]
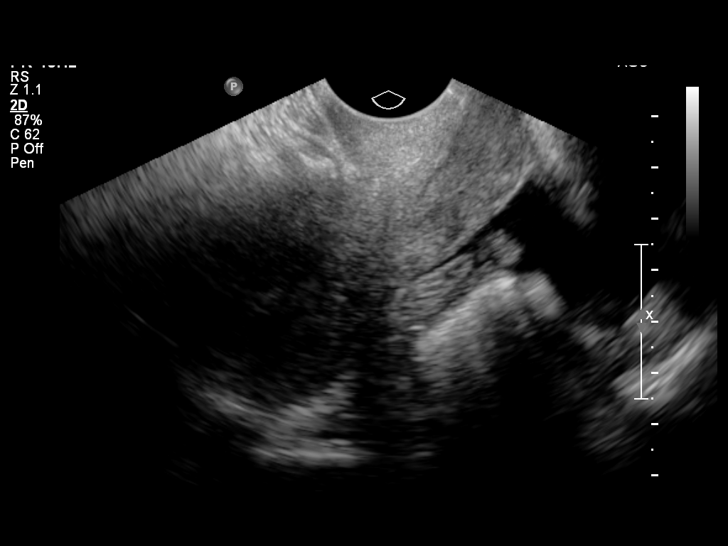
[im 7/24]
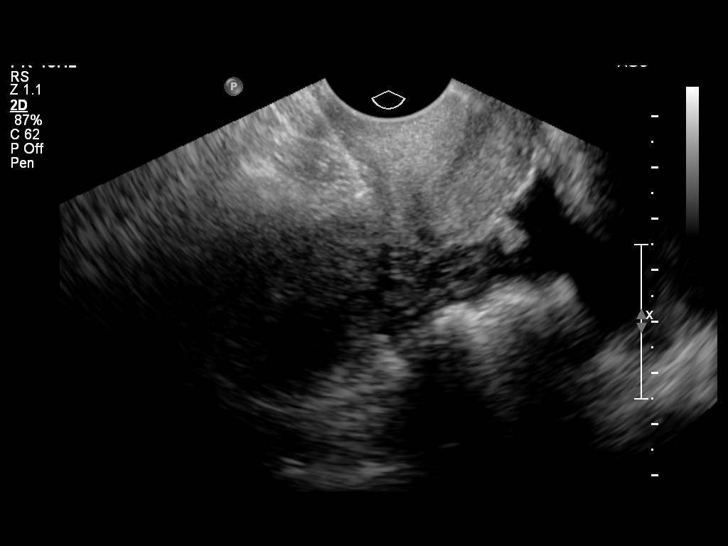
[im 9/24]
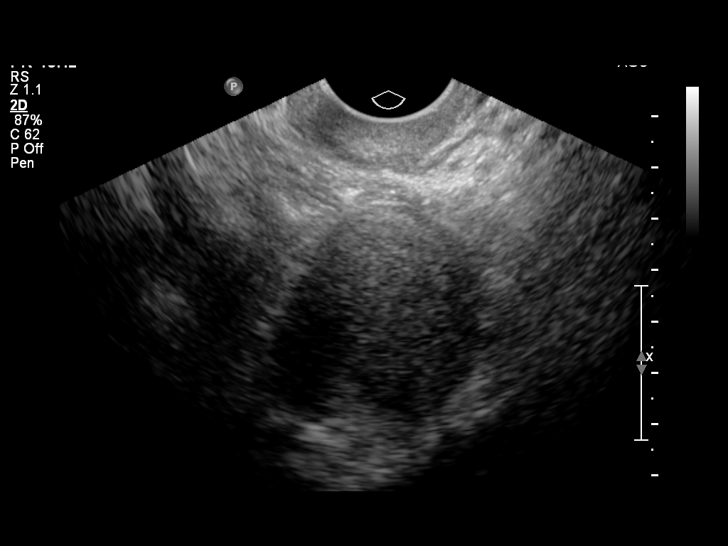
[im 11/24]
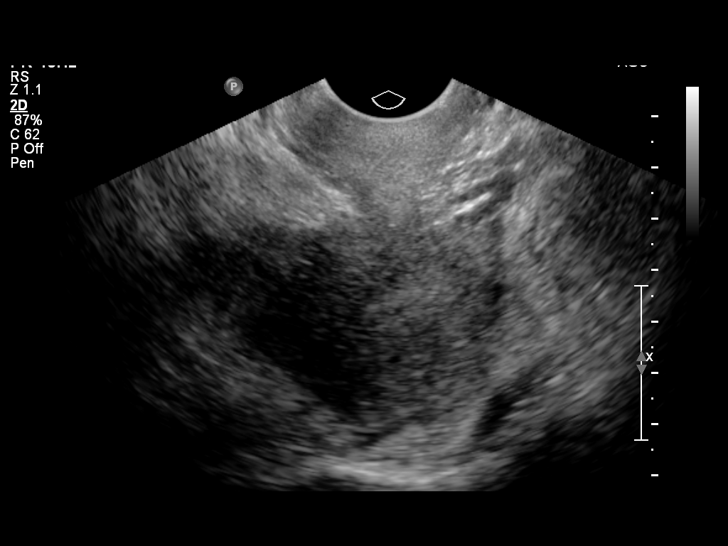
[im 13/24]
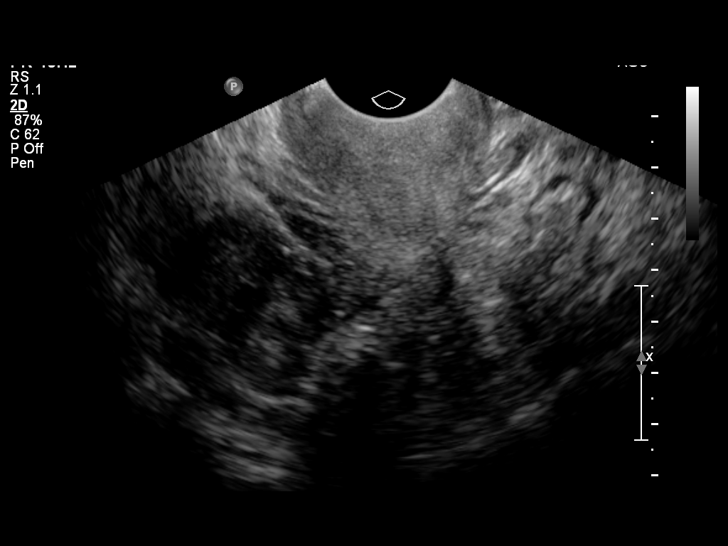
[im 14/24]
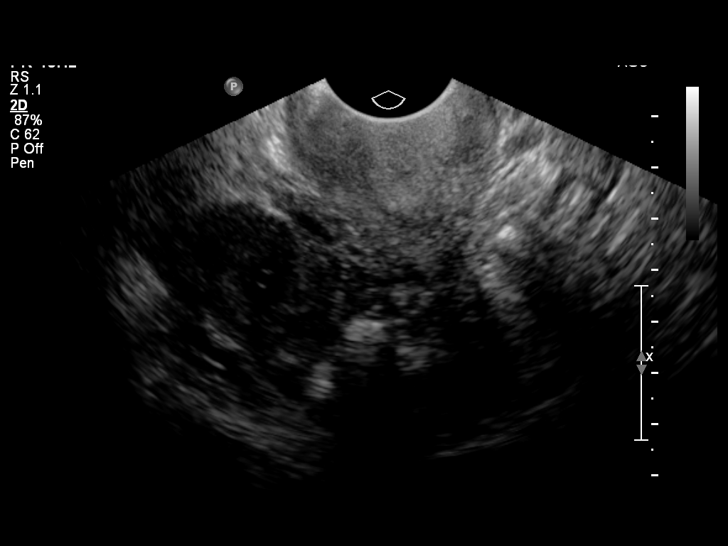
[im 16/24]
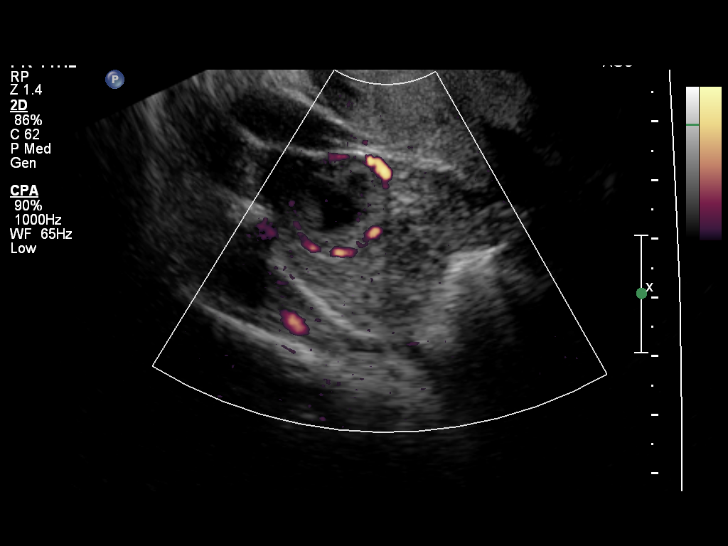
[im 18/24]
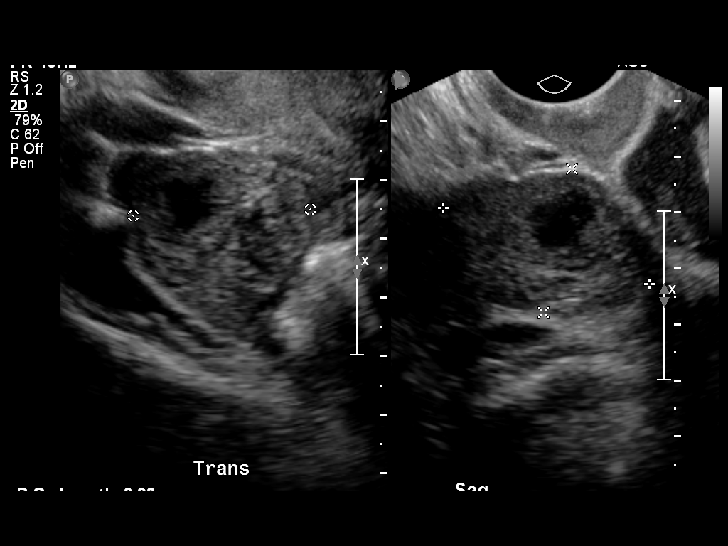
[im 20/24]
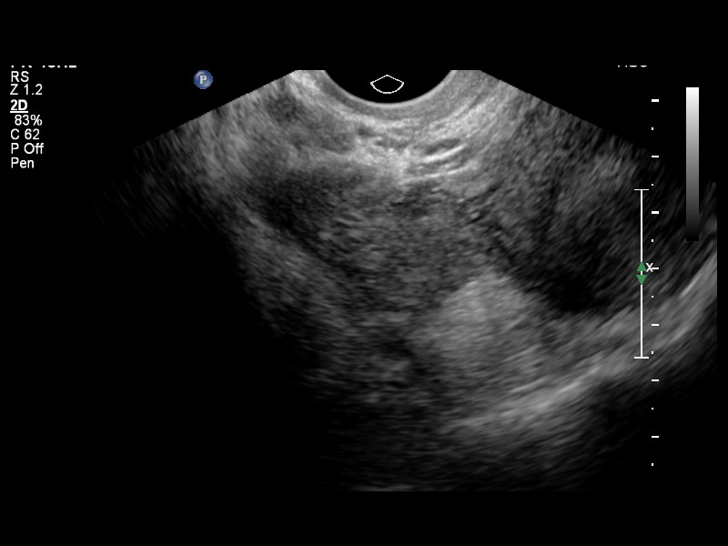
[im 22/24]
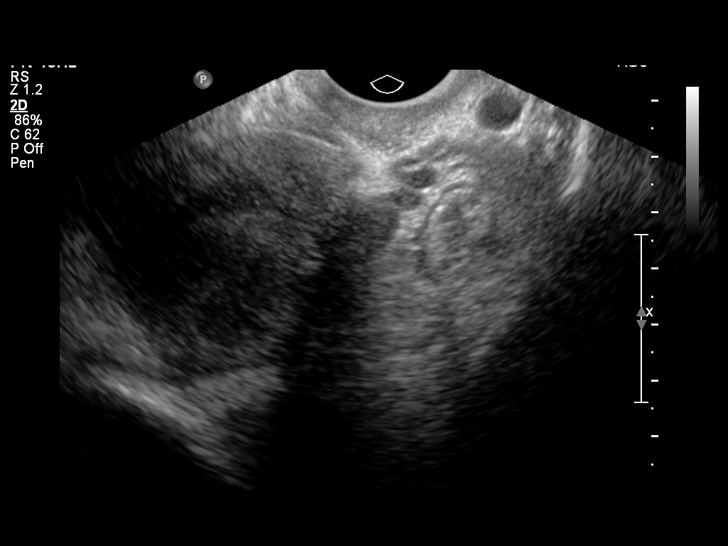
[im 24/24]
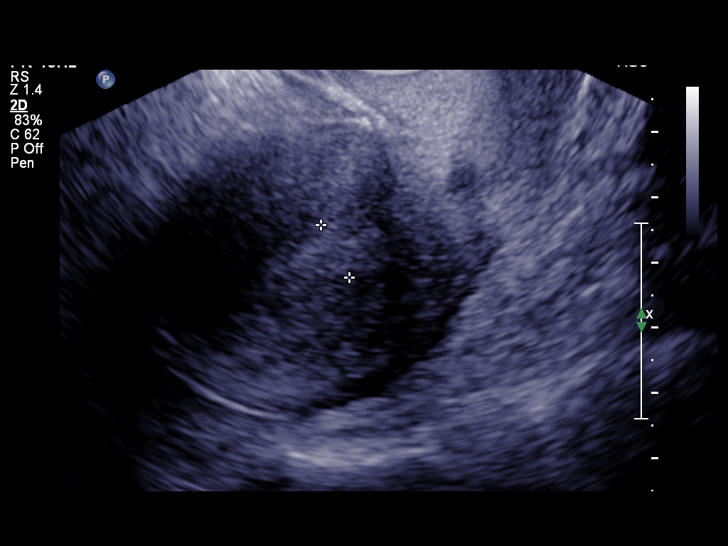

[13 of 24 positions shown; findings below may reference images not displayed]

FINDINGS: Intrauterine gestational sac: Not visualized.

Yolk sac:  Not visualized.

Embryo:  Not visualized.

Maternal uterus/adnexae: No evidence of focal uterine or ovarian
abnormality. Tiny physiologic cyst right ovary. Prior left
salpingectomy. Left ovary not visualized. No prominent free pelvic
fluid.
IMPRESSION: No intrauterine pregnancy identified. Given serial beta HCG findings
and the patient's history of multiple ectopic pregnancies and pain
these findings are suspicious for the presence of an ectopic
pregnancy. No ectopic pregnancy is identified sonographically.
Continued follow-up pelvic ultrasounds can be obtained as needed.
Serial beta-hCGs should be considered. Early IUP or missed abortion
could also present with a normal appearing pelvic ultrasound. These
results will be called to the ordering clinician or representative
by the Radiologist Assistant, and communication documented in the
PACS or zVision Dashboard.

## 2015-07-05 ENCOUNTER — Encounter (HOSPITAL_COMMUNITY): Payer: Self-pay | Admitting: *Deleted

## 2017-07-03 ENCOUNTER — Other Ambulatory Visit: Payer: Self-pay

## 2017-07-03 ENCOUNTER — Encounter (HOSPITAL_COMMUNITY): Payer: Self-pay | Admitting: *Deleted

## 2017-07-03 ENCOUNTER — Inpatient Hospital Stay (HOSPITAL_COMMUNITY)
Admission: AD | Admit: 2017-07-03 | Discharge: 2017-07-03 | Disposition: A | Discharge status: Home / Self Care | Payer: Medicare Other | Source: Ambulatory Visit | Attending: Obstetrics and Gynecology | Admitting: Obstetrics and Gynecology

## 2017-07-03 DIAGNOSIS — I10 Essential (primary) hypertension: Secondary | ICD-10-CM | POA: Insufficient documentation

## 2017-07-03 DIAGNOSIS — R1032 Left lower quadrant pain: Secondary | ICD-10-CM | POA: Insufficient documentation

## 2017-07-03 DIAGNOSIS — Z79899 Other long term (current) drug therapy: Secondary | ICD-10-CM | POA: Diagnosis not present

## 2017-07-03 DIAGNOSIS — Z9079 Acquired absence of other genital organ(s): Secondary | ICD-10-CM | POA: Diagnosis not present

## 2017-07-03 DIAGNOSIS — Z3202 Encounter for pregnancy test, result negative: Secondary | ICD-10-CM | POA: Insufficient documentation

## 2017-07-03 DIAGNOSIS — Z87891 Personal history of nicotine dependence: Secondary | ICD-10-CM | POA: Insufficient documentation

## 2017-07-03 DIAGNOSIS — Z794 Long term (current) use of insulin: Secondary | ICD-10-CM | POA: Insufficient documentation

## 2017-07-03 DIAGNOSIS — A599 Trichomoniasis, unspecified: Secondary | ICD-10-CM

## 2017-07-03 DIAGNOSIS — E119 Type 2 diabetes mellitus without complications: Secondary | ICD-10-CM | POA: Insufficient documentation

## 2017-07-03 HISTORY — DX: Trichomoniasis, unspecified: A59.9

## 2017-07-03 LAB — URINALYSIS, ROUTINE W REFLEX MICROSCOPIC
BILIRUBIN URINE: NEGATIVE
GLUCOSE, UA: NEGATIVE mg/dL
Ketones, ur: NEGATIVE mg/dL
NITRITE: NEGATIVE
Protein, ur: NEGATIVE mg/dL
SPECIFIC GRAVITY, URINE: 1.027 (ref 1.005–1.030)
pH: 5 (ref 5.0–8.0)

## 2017-07-03 LAB — WET PREP, GENITAL
Sperm: NONE SEEN
Yeast Wet Prep HPF POC: NONE SEEN

## 2017-07-03 LAB — CBC
HCT: 40.5 % (ref 36.0–46.0)
Hemoglobin: 13.9 g/dL (ref 12.0–15.0)
MCH: 31.4 pg (ref 26.0–34.0)
MCHC: 34.3 g/dL (ref 30.0–36.0)
MCV: 91.4 fL (ref 78.0–100.0)
Platelets: 352 10*3/uL (ref 150–400)
RBC: 4.43 MIL/uL (ref 3.87–5.11)
RDW: 14.2 % (ref 11.5–15.5)
WBC: 7.5 10*3/uL (ref 4.0–10.5)

## 2017-07-03 LAB — HCG, QUANTITATIVE, PREGNANCY

## 2017-07-03 LAB — POCT PREGNANCY, URINE: Preg Test, Ur: NEGATIVE

## 2017-07-03 MED ORDER — METRONIDAZOLE 500 MG PO TABS
2000.0000 mg | ORAL_TABLET | Freq: Once | ORAL | Status: AC
  Administered 2017-07-03: 2000 mg via ORAL
  Filled 2017-07-03: qty 4

## 2017-07-03 NOTE — MAU Note (Signed)
Pt presents with c/io pregnancy confirmation & LLQ pain  Reports took 2 UPT @ home, 1 positive & 1 negative.  Reports Hx of 4 ectopic pregnancies, last one in 2017.  Reports LMP 05/29/2017.

## 2017-07-03 NOTE — MAU Provider Note (Signed)
History     CSN: [id number]  Arrival date and time: 07/03/17 1158  Chief Complaint  Patient presents with  . Possible Pregnancy  . Abdominal Pain   42 y.o. female her with LLQ pain and possible pregnancy. She had one positive home test and one negative. Pain started a few days ago. Describes as mild, nagging, intermittent pain in LLQ. Has not taken anything for it. Is concerned because of hx of ectopic pregnancy x4. Hx of left salpingectomy and Rt salpingostomy. Denies STI hx and currently non-smoker. No new partner in 16 years.   Past Medical History:  Diagnosis Date  . Diabetes mellitus without complication (HCC)   . Hypertension     Past Surgical History:  Procedure Laterality Date  . CESAREAN SECTION    . CESAREAN SECTION WITH BILATERAL TUBAL LIGATION  2003  . LAPAROSCOPY N/A 05/29/2013   Procedure: LAPAROSCOPY OPERATIVE with Right Salpingostomy;  Surgeon: Tilda Burrow, MD;  Location: [facility];  Service: Gynecology;  Laterality: N/A;  . OTHER SURGICAL HISTORY  2012   BTL reversal  . revers    . SALPINGECTOMY Left    ruptured ectopic  . TUBAL LIGATION      Family History  Problem Relation Age of Onset  . Alcohol abuse Neg Hx   . Arthritis Neg Hx   . Asthma Neg Hx   . Birth defects Neg Hx   . Cancer Neg Hx   . COPD Neg Hx   . Depression Neg Hx   . Diabetes Neg Hx   . Drug abuse Neg Hx   . Early death Neg Hx   . Hearing loss Neg Hx   . Heart disease Neg Hx   . Hyperlipidemia Neg Hx   . Hypertension Neg Hx   . Kidney disease Neg Hx   . Learning disabilities Neg Hx   . Mental illness Neg Hx   . Mental retardation Neg Hx   . Miscarriages / Stillbirths Neg Hx   . Stroke Neg Hx   . Vision loss Neg Hx   . Varicose Veins Neg Hx     Social History   Tobacco Use  . Smoking status: Former Games developer  . Smokeless tobacco: Never Used  Substance Use Topics  . Alcohol use: No  . Drug use: No    Allergies: No Known Allergies  Medications Prior to Admission   Medication Sig Dispense Refill Last Dose  . insulin NPH-regular Human (NOVOLIN 70/30) (70-30) 100 UNIT/ML injection Inject 22 Units into the skin 2 (two) times daily with a meal.    07/03/2017 at 1000  . lubiprostone (AMITIZA) 24 MCG capsule Take 24 mcg by mouth 2 (two) times daily with a meal.   07/03/2017 at Unknown time  . metoprolol tartrate (LOPRESSOR) 25 MG tablet Take 25 mg by mouth 2 (two) times daily.   07/03/2017 at Unknown time  . acetaminophen (TYLENOL) 500 MG tablet Take 1,000 mg by mouth every 8 (eight) hours as needed for mild pain or moderate pain.   09/03/2014 at Unknown time    Review of Systems  Constitutional: Negative for chills and fever.  Gastrointestinal: Positive for abdominal pain.  Genitourinary: Negative for dysuria, frequency, urgency, vaginal bleeding and vaginal discharge.   Physical Exam   Blood pressure (!) 134/96, pulse (!) 116, temperature 98.3 F (36.8 C), temperature source Oral, resp. rate 20, height 5\' 4"  (1.626 m), weight 133 lb 4 oz (60.4 kg), last menstrual period 05/29/2017, SpO2 100 %, unknown if  currently breastfeeding.  Physical Exam  Nursing note and vitals reviewed. Constitutional: She is oriented to person, place, and time. She appears well-developed and well-nourished. No distress.  HENT:  Head: Normocephalic and atraumatic.  Neck: Normal range of motion.  Respiratory: Effort normal. No respiratory distress.  GI: Soft. She exhibits no distension and no mass. There is no tenderness. There is no rebound and no guarding.  Genitourinary:  Genitourinary Comments: External: no lesions or erythema Vagina: rugated, pink, moist, thin white frothy discharge Uterus: non enlarged, anteverted, non tender, no CMT Adnexae: no masses, no tenderness left, no tenderness right   Musculoskeletal: Normal range of motion.  Neurological: She is alert and oriented to person, place, and time.  Skin: Skin is warm and dry.  Psychiatric: She has a normal mood and  affect.   Results for orders placed or performed during the hospital encounter of 07/03/17 (from the past 24 hour(s))  Urinalysis, Routine w reflex microscopic     Status: Abnormal   Collection Time: 07/03/17 12:20 PM  Result Value Ref Range   Color, Urine YELLOW YELLOW   APPearance HAZY (A) CLEAR   Specific Gravity, Urine 1.027 1.005 - 1.030   pH 5.0 5.0 - 8.0   Glucose, UA NEGATIVE NEGATIVE mg/dL   Hgb urine dipstick MODERATE (A) NEGATIVE   Bilirubin Urine NEGATIVE NEGATIVE   Ketones, ur NEGATIVE NEGATIVE mg/dL   Protein, ur NEGATIVE NEGATIVE mg/dL   Nitrite NEGATIVE NEGATIVE   Leukocytes, UA TRACE (A) NEGATIVE   RBC / HPF 0-5 0 - 5 RBC/hpf   WBC, UA 0-5 0 - 5 WBC/hpf   Bacteria, UA RARE (A) NONE SEEN   Squamous Epithelial / LPF 6-30 (A) NONE SEEN   Mucus PRESENT   Pregnancy, urine POC     Status: None   Collection Time: 07/03/17 12:23 PM  Result Value Ref Range   Preg Test, Ur NEGATIVE NEGATIVE  CBC     Status: None   Collection Time: 07/03/17 12:38 PM  Result Value Ref Range   WBC 7.5 4.0 - 10.5 K/uL   RBC 4.43 3.87 - 5.11 MIL/uL   Hemoglobin 13.9 12.0 - 15.0 g/dL   HCT 16.1 09.6 - 04.5 %   MCV 91.4 78.0 - 100.0 fL   MCH 31.4 26.0 - 34.0 pg   MCHC 34.3 30.0 - 36.0 g/dL   RDW 40.9 81.1 - 91.4 %   Platelets 352 150 - 400 K/uL  hCG, quantitative, pregnancy     Status: None   Collection Time: 07/03/17 12:38 PM  Result Value Ref Range   hCG, Beta Chain, Quant, S <1 <5 mIU/mL  Wet prep, genital     Status: Abnormal   Collection Time: 07/03/17  1:00 PM  Result Value Ref Range   Yeast Wet Prep HPF POC NONE SEEN NONE SEEN   Trich, Wet Prep PRESENT (A) NONE SEEN   Clue Cells Wet Prep HPF POC PRESENT (A) NONE SEEN   WBC, Wet Prep HPF POC FEW (A) NONE SEEN   Sperm NONE SEEN    MAU Course  Procedures Flagyl 2g  MDM Labs ordered and reviewed. No evidence of pregnancy. +trich and this could be cause of pain. Pt does not want to become pregnant, encouraged pt to make appt  in clinic to discuss contraception. Recommend partner treatment and then abstain from IC x2 weeks after partner treated. Stable for discharge home.   Assessment and Plan   1. Pregnancy examination or test, negative result  2. Trichomoniasis    Discharge home Follow up in GYN office  Abstain from IC  Allergies as of 07/03/2017   No Known Allergies     Medication List    TAKE these medications   acetaminophen 500 MG tablet Commonly known as:  TYLENOL Take 1,000 mg by mouth every 8 (eight) hours as needed for mild pain or moderate pain.   insulin NPH-regular Human (70-30) 100 UNIT/ML injection Commonly known as:  NOVOLIN 70/30 Inject 22 Units into the skin 2 (two) times daily with a meal.   lubiprostone 24 MCG capsule Commonly known as:  AMITIZA Take 24 mcg by mouth 2 (two) times daily with a meal.   metoprolol tartrate 25 MG tablet Commonly known as:  LOPRESSOR Take 25 mg by mouth 2 (two) times daily.      Donette LarryMelanie Carlton Buskey, CNM 07/03/2017, 2:18 PM

## 2017-07-03 NOTE — Discharge Instructions (Signed)
Trichomoniasis °Trichomoniasis is an STI (sexually transmitted infection) that can affect both women and men. In women, the outer area of the female genitalia (vulva) and the vagina are affected. In men, the penis is mainly affected, but the prostate and other reproductive organs can also be involved. This condition can be treated with medicine. It often has no symptoms (is asymptomatic), especially in men. °What are the causes? °This condition is caused by an organism called Trichomonas vaginalis. Trichomoniasis most often spreads from person to person (is contagious) through sexual contact. °What increases the risk? °The following factors may make you more likely to develop this condition: °· Having unprotected sexual intercourse. °· Having sexual intercourse with a partner who has trichomoniasis. °· Having multiple sexual partners. °· Having had previous trichomoniasis infections or other STIs. ° °What are the signs or symptoms? °In women, symptoms of trichomoniasis include: °· Abnormal vaginal discharge that is clear, white, gray, or yellow-green and foamy and has an unusual "fishy" odor. °· Itching and irritation of the vagina and vulva. °· Burning or pain during urination or sexual intercourse. °· Genital redness and swelling. ° °In men, symptoms of trichomoniasis include: °· Penile discharge that may be foamy or contain pus. °· Pain in the penis. This may happen only when urinating. °· Itching or irritation inside the penis. °· Burning after urination or ejaculation. ° °How is this diagnosed? °In women, this condition may be found during a routine Pap test or physical exam. It may be found in men during a routine physical exam. Your health care provider may perform tests to help diagnose this infection, such as: °· Urine tests (men and women). °· The following in women: °? Testing the pH of the vagina. °? A vaginal swab test that checks for the Trichomonas vaginalis organism. °? Testing vaginal  secretions. ° °Your health care provider may test you for other STIs, including HIV (human immunodeficiency virus). °How is this treated? °This condition is treated with medicine taken by mouth (orally), such as metronidazole or tinidazole to fight the infection. Your sexual partner(s) may also need to be tested and treated. °· If you are a woman and you plan to become pregnant or think you may be pregnant, tell your health care provider right away. Some medicines that are used to treat the infection should not be taken during pregnancy. ° °Your health care provider may recommend over-the-counter medicines or creams to help relieve itching or irritation. You may be tested for infection again 3 months after treatment. °Follow these instructions at home: °· Take and use over-the-counter and prescription medicines, including creams, only as told by your health care provider. °· Do not have sexual intercourse until one week after you finish your medicine, or until your health care provider approves. Ask your health care provider when you may resume sexual intercourse. °· (Women) Do not douche or wear tampons while you have the infection. °· Discuss your infection with your sexual partner(s). Make sure that your partner gets tested and treated, if necessary. °· Keep all follow-up visits as told by your health care provider. This is important. °How is this prevented? °· Use condoms every time you have sex. Using condoms correctly and consistently can help protect against STIs. °· Avoid having multiple sexual partners. °· Talk with your sexual partner about any symptoms that either of you may have, as well as any history of STIs. °· Get tested for STIs and STDs (sexually transmitted diseases) before you have sex. Ask your partner   to do the same.  Do not have sexual contact if you have symptoms of trichomoniasis or another STI. Contact a health care provider if:  You still have symptoms after you finish your  medicine.  You develop pain in your abdomen.  You have pain when you urinate.  You have bleeding after sexual intercourse.  You develop a rash.  You feel nauseous or you vomit.  You plan to become pregnant or think you may be pregnant. Summary  Trichomoniasis is an STI (sexually transmitted infection) that can affect both women and men.  This condition often has no symptoms (is asymptomatic), especially in men.  You should not have sexual intercourse until one week after you finish your medicine, or until your health care provider approves. Ask your health care provider when you may resume sexual intercourse.  Discuss your infection with your sexual partner. Make sure that your partner gets tested and treated, if necessary. This information is not intended to replace advice given to you by your health care provider. Make sure you discuss any questions you have with your health care provider. Document Released: 11/22/2000 Document Revised: 04/21/2016 Document Reviewed: 04/21/2016 Elsevier Interactive Patient Education  2017 [employer]   In late 2019, the [facility] will be [facility] the [facility]. At that time, the MAU (Maternity Admissions Unit), where you are being seen today, will no longer take care of non-pregnant patients. We strongly encourage you to find a doctor's office before that time, so that you can be seen with any GYN concerns, like vaginal discharge, urinary tract infection, etc.. in a timely manner.  In order to make an office visit more convenient, the Center for [facility] at [facility] will be offering evening hours with same-day appointments, walk-in appointments and scheduled appointments available during this time.  Center for [facility] @ [facility] Hours: Monday - 8am - 7:30 pm with walk-in between 4pm- 7:30 pm Tuesday - 8 am - 5 pm (starting 09/11/17 we will be open late and accepting walk-ins from 4pm -  7:30pm) Wednesday - 8 am - 5 pm (starting 12/12/17 we will be open late and accepting walk-ins from 4pm - 7:30pm) Thursday 8 am - 5 pm (starting 03/14/18 we will be open late and accepting walk-ins from 4pm - 7:30pm) Friday 8 am - 5 pm  For an appointment please call the Center for [facility] @ [facility] at [phone]  For urgent needs, [facility] is also available for management of urgent GYN complaints such as vaginal discharge or urinary tract infections.

## 2017-07-04 LAB — GC/CHLAMYDIA PROBE AMP (~~LOC~~) NOT AT ARMC
Chlamydia: NEGATIVE
Neisseria Gonorrhea: NEGATIVE

## 2019-01-21 ENCOUNTER — Other Ambulatory Visit: Payer: Medicare Other

## 2019-01-21 ENCOUNTER — Other Ambulatory Visit: Payer: Self-pay

## 2019-01-21 NOTE — Progress Notes (Unsigned)
Pt here today for beta quant.  Pt informed me that she had a positive and 2 negative test last week.  Pt also reports that she has not had a period in June.  I advised pt to take digital pregnancy test that clearly states not pregnant/pregnant tomorrow and then wait another a week.  Pt reports that she is just concerned of having an ectopic pregnancy because she has dealt with several surgeries in her past dealing with that.  I advised to the pt that if she starts to have any unilateral pain to please call the office.  I advised pt to take pregnancy test tomorrow and then next week if her period has not come yet and if her pregnancy test are negative I encouraged her to schedule an appt for missed periods.  Pt verbalized understanding.   Mel Almond, RN 01/21/19

## 2020-04-14 ENCOUNTER — Ambulatory Visit: Payer: Medicare Other | Admitting: Cardiology

## 2020-04-14 ENCOUNTER — Other Ambulatory Visit: Payer: Self-pay

## 2020-04-14 ENCOUNTER — Encounter: Payer: Self-pay | Admitting: Cardiology

## 2020-04-14 VITALS — BP 141/89 | HR 95 | Resp 16 | Ht 64.0 in | Wt 154.0 lb

## 2020-04-14 DIAGNOSIS — R002 Palpitations: Secondary | ICD-10-CM

## 2020-04-14 DIAGNOSIS — R0602 Shortness of breath: Secondary | ICD-10-CM

## 2020-04-14 DIAGNOSIS — Z794 Long term (current) use of insulin: Secondary | ICD-10-CM

## 2020-04-14 DIAGNOSIS — E782 Mixed hyperlipidemia: Secondary | ICD-10-CM

## 2020-04-14 DIAGNOSIS — Z9981 Dependence on supplemental oxygen: Secondary | ICD-10-CM

## 2020-04-14 DIAGNOSIS — Z87891 Personal history of nicotine dependence: Secondary | ICD-10-CM

## 2020-04-14 DIAGNOSIS — J449 Chronic obstructive pulmonary disease, unspecified: Secondary | ICD-10-CM

## 2020-04-14 DIAGNOSIS — E1065 Type 1 diabetes mellitus with hyperglycemia: Secondary | ICD-10-CM

## 2020-04-14 DIAGNOSIS — Z8616 Personal history of COVID-19: Secondary | ICD-10-CM

## 2020-04-14 DIAGNOSIS — I471 Supraventricular tachycardia: Secondary | ICD-10-CM

## 2020-04-14 NOTE — Progress Notes (Signed)
Date:  04/14/2020   ID:  Michelle Duffy, DOB Jun 30, 1975, MRN [id number]  PCP:  Dr. Wynelle Fanny DO Cardiologist:  Rex Kras, DO, [facility] (established care 04/14/2020) Former Cardiology Providers: Dr. Abran Richard ([facility]).   REASON FOR CONSULT: PSVT (self referral).   REQUESTING PHYSICIAN:  Self Referral.   Chief Complaint  Patient presents with   Palpitations   PSVT   New Patient (Initial Visit)    HPI  Michelle Duffy is a 44 y.o. female who presents to the office with a chief complaint of " palpitations and history of PSVT." Patient's past medical history and cardiovascular risk factors include: IDDM Type I, PSVT, hypertension, former smoker, COPD /emphysema, hyperlipidemia, history of COVID-19 infection x2.  Patient was formally under the care of Dr. Otho Perl at [facility] and now presents to the office as a self-referral for transition of care/second opinion.  Patient states that she has had underlying tachycardia for several years now and has been on metoprolol for the last 10 years.  She was recently started on diltiazem 240 mg p.o. daily approximately 6 months ago.  She continues to have palpitations/tachycardia mostly with effort related activities.  The symptoms have been more prominent since her second COVID-19 infection which was diagnosed on March 06, 2020.  Given her history of paroxysmal supraventricular tachycardia she has been advised to perform maneuvers such as increasing Valsalva to help alleviate alleviate the symptoms.  Patient stated these maneuvers still help with the frequency of palpitations and tachycardia have increased after COVID-19 infection.  Recently she has also been diagnosed with COPD/emphysema and is on 2 L nasal cannula oxygen around-the-clock for the last 7 months. She follows up with Dr. Worthy Keeler at [facility].  From a cardiovascular standpoint she had an echocardiogram with Dr. Otho Perl which  noted preserved LVEF and normal diastolic function per report.  She also had a Holter monitor less than 6 months ago.  I do not have the results to review during this encounter.  She denies the use of caffeinated beverages, soda, illicit drugs, stimulants, over-the-counter medications or herbal supplements.  She has never been evaluated by pediatric cardiologist while growing up and used to run track and never had a syncopal event in the past.  Family history of premature coronary artery disease with dad having his first myocardial infarction at the age of 51.  FUNCTIONAL STATUS: No structured exercise program or daily routine.   ALLERGIES: No Known Allergies  MEDICATION LIST PRIOR TO VISIT: Current Meds  Medication Sig   acetaminophen (TYLENOL) 500 MG tablet Take 1,000 mg by mouth every 8 (eight) hours as needed for mild pain or moderate pain.   albuterol (ACCUNEB) 0.63 MG/3ML nebulizer solution as needed.   albuterol (VENTOLIN HFA) 108 (90 Base) MCG/ACT inhaler Inhale 2 puffs into the lungs every 6 (six) hours as needed.   amitriptyline (ELAVIL) 100 MG tablet Take 100 mg by mouth at bedtime.   atorvastatin (LIPITOR) 10 MG tablet Take 1 tablet by mouth daily.   budesonide-formoterol (SYMBICORT) 160-4.5 MCG/ACT inhaler Inhale 2 puffs into the lungs 2 (two) times daily.   diltiazem (CARDIZEM LA) 240 MG 24 hr tablet Take 1 tablet by mouth daily.    fluticasone (FLOVENT HFA) 110 MCG/ACT inhaler Inhale into the lungs as needed.   Insulin Lispro Prot & Lispro (HUMALOG MIX 75/25 KWIKPEN) (75-25) 100 UNIT/ML Kwikpen 25 Units daily.   lubiprostone (AMITIZA) 24 MCG capsule Take 24  mcg by mouth 2 (two) times daily with a meal.   magnesium gluconate (MAGONATE) 500 MG tablet Take 2 tablets by mouth daily.   metoprolol succinate (TOPROL-XL) 100 MG 24 hr tablet Take 100 mg by mouth in the morning. Take with or immediately following a meal.   metoprolol succinate (TOPROL-XL) 50 MG 24 hr  tablet Take 50 mg by mouth at bedtime. Take with or immediately following a meal.   mirtazapine (REMERON) 30 MG tablet Take 30 mg by mouth at bedtime.   oxyCODONE-acetaminophen (PERCOCET) 10-325 MG tablet Take 1 tablet by mouth 3 (three) times daily.   potassium chloride SA (KLOR-CON) 20 MEQ tablet Take 1 tablet by mouth daily.   pregabalin (LYRICA) 100 MG capsule Take 100 mg by mouth 3 (three) times daily.     PAST MEDICAL HISTORY: Past Medical History:  Diagnosis Date   COPD (chronic obstructive pulmonary disease) ([place])    DM (diabetes mellitus) with complications ([place])    Hyperlipidemia    Hypertension    Oxygen dependent    PSVT (paroxysmal supraventricular tachycardia) ([place])    Trichomonas infection 07/03/2017    PAST SURGICAL HISTORY: Past Surgical History:  Procedure Laterality Date   CESAREAN SECTION     CESAREAN SECTION WITH BILATERAL TUBAL LIGATION  2003   LAPAROSCOPY N/A 05/29/2013   Procedure: LAPAROSCOPY OPERATIVE with Right Salpingostomy;  Surgeon: Jonnie Kind, MD;  Location: [facility];  Service: Gynecology;  Laterality: N/A;   OTHER SURGICAL HISTORY  2012   BTL reversal   revers     SALPINGECTOMY Left    ruptured ectopic   TUBAL LIGATION      FAMILY HISTORY: The patient family history includes Heart attack in her father; Heart disease in her mother; Heart failure in her mother; Stroke in her mother.  SOCIAL HISTORY:  The patient  reports that she quit smoking about 22 months ago. Her smoking use included cigarettes. She has a 8.00 pack-year smoking history. She has never used smokeless tobacco. She reports that she does not drink alcohol and does not use drugs.  REVIEW OF SYSTEMS: Review of Systems  Constitutional: Negative for chills and fever.  HENT: Negative for hoarse voice and nosebleeds.   Eyes: Negative for discharge, double vision and pain.  Cardiovascular: Positive for dyspnea on exertion and palpitations. Negative for chest  pain, claudication, leg swelling, near-syncope, orthopnea, paroxysmal nocturnal dyspnea and syncope.  Respiratory: Positive for shortness of breath. Negative for hemoptysis.   Musculoskeletal: Negative for muscle cramps and myalgias.  Gastrointestinal: Negative for abdominal pain, constipation, diarrhea, hematemesis, hematochezia, melena, nausea and vomiting.  Neurological: Negative for focal weakness, loss of balance and weakness.   PHYSICAL EXAM: Vitals with BMI 04/14/2020 07/03/2017 07/03/2017  Height 5' 4" - 5' 4"  Weight 154 lbs - 133 lbs 4 oz  BMI 79.39 - 03.00  Systolic 923 300 762  Diastolic 89 88 96  Pulse 95 101 116    CONSTITUTIONAL: Appears older than stated age, hemodynamically stable, No acute distress.  SKIN: Skin is warm and dry. No rash noted. No cyanosis. No pallor. No jaundice HEAD: Normocephalic and atraumatic.  EYES: No scleral icterus MOUTH/THROAT: Dry oral membranes. Nasal cannula oxygen.  NECK: No JVD present. No thyromegaly noted. No carotid bruits  LYMPHATIC: No visible cervical adenopathy.  CHEST Normal respiratory effort. No intercostal retractions  LUNGS: Decreased breath sounds bilaterally.  No stridor. No wheezes. No rales.  CARDIOVASCULAR: Regular, positive S1-S2, no murmurs rubs or gallops appreciated. ABDOMINAL: No  apparent ascites.  EXTREMITIES: No peripheral edema, dry skin noted bilaterally, poor nail hygiene, +2 dorsalis pedis and posterior tibial pulses. HEMATOLOGIC: No significant bruising NEUROLOGIC: Oriented to person, place, and time. Nonfocal. Normal muscle tone.  PSYCHIATRIC: Normal mood and affect. Normal behavior. Cooperative  CARDIAC DATABASE: EKG: 04/14/2020: Sinus  Rhythm, 91 bpm, normal axis, right atrial enlargement, without underlying ischemia or injury pattern.  Echocardiogram: 03/03/2019 performed [facility] by Dr. Otho Perl: LVEF 38-45%, normal diastolic function, no valvular heart disease.  Stress  Testing: No results found for this or any previous visit from the past 1095 days.  Heart Catheterization: None  LABORATORY DATA: CBC Latest Ref Rng & Units 07/03/2017 08/25/2014 05/28/2013  WBC 4.0 - 10.5 K/uL 7.5 7.4 9.2  Hemoglobin 12.0 - 15.0 g/dL 13.9 12.8 11.7(L)  Hematocrit 36 - 46 % 40.5 38.9 33.6(L)  Platelets 150 - 400 K/uL 352 338 300    CMP Latest Ref Rng & Units 09/05/2014 05/28/2013  Glucose 70 - 99 mg/dL - 106(H)  BUN 6 - 23 mg/dL 10 11  Creatinine 0.50 - 1.10 mg/dL 0.80 0.78  Sodium 135 - 145 mEq/L - 134(L)  Potassium 3.5 - 5.1 mEq/L - 4.3  Chloride 96 - 112 mEq/L - 100  CO2 19 - 32 mEq/L - 25  Calcium 8.4 - 10.5 mg/dL - 9.7  Total Protein 6.0 - 8.3 g/dL - 7.2  Total Bilirubin 0.3 - 1.2 mg/dL - 0.1(L)  Alkaline Phos 39 - 117 U/L - 79  AST 0 - 37 U/L 18 21  ALT 0 - 35 U/L - 29    Lipid Panel  No results found for: CHOL, TRIG, HDL, CHOLHDL, VLDL, LDLCALC, LDLDIRECT, LABVLDL  No components found for: NTPROBNP No results for input(s): PROBNP in the last 8760 hours. No results for input(s): TSH in the last 8760 hours.  BMP No results for input(s): NA, K, CL, CO2, GLUCOSE, BUN, CREATININE, CALCIUM, GFRNONAA, GFRAA in the last 8760 hours.  HEMOGLOBIN A1C No results found for: HGBA1C, MPG   External Labs: [facility]. Collected: 09/24/2019 Creatinine 0.78 mg/dL. eGFR: >90 mL/min per 1.73 m  07/11/2019: TSH 2.45  IMPRESSION:    ICD-10-CM   1. Palpitations  R00.2 EKG 12-Lead  2. PSVT (paroxysmal supraventricular tachycardia) (HCC)  I47.1   3. Shortness of breath  R06.02   4. History of COVID-19  Z86.16   5. Oxygen dependent  Z99.81   6. Chronic obstructive pulmonary disease, unspecified COPD type ([place])  J44.9   7. Former smoker  Z87.891   39. Type 1 diabetes mellitus with hyperglycemia (HCC)  E10.65   9. Long-term insulin use (HCC)  Z79.4   10. Mixed hyperlipidemia  E78.2      RECOMMENDATIONS: Michelle Duffy is a 44 y.o.  female whose past medical history and cardiac risk factors include: IDDM Type I, PSVT, hypertension, former smoker, COPD /emphysema, hyperlipidemia, history of COVID-19 infection x2.  Palpitations / PSVT:   Patient carries a diagnosis of PSVT in the past and has been on metoprolol succinate as well as diltiazem.  She states that she has been on metoprolol succinate for approximately 10 years and diltiazem was recently started 6 months ago.  Her symptoms of palpitations have gotten worse since her recent COVID-19 infection in September 2022.  She continues to get palpitations and shortness of breath with effort related activities.  She was recently placed on oxygen given her underlying emphysema/COPD/Covid infection x2.  She follows up  with pulmonology as well.  She has been told in the past that her underlying palpitations have been secondary to her pulmonary conditions.  EKG shows normal sinus rhythm without underlying dysrhythmias.  She currently takes diltiazem 240 mg p.o. daily, Toprol-XL 100 mg p.o. every morning, Toprol-XL 50 mg p.o. every afternoon.  She recently had two Holter monitor last one less than 6 months ago.  I will request records from Dr. Otho Perl office.  Last echocardiogram noted preserved LVEF and normal diastolic function without any significant valvular heart disease as per report from [facility].  Recommended increasing the Toprol-XL to 100 mg p.o. twice daily to see if it improves her symptoms.   Patient was reinforced that the care she received at Dr. Otho Perl was very appropriate. She is more than happy to continue care with our practice or resume care with Dr. Otho Perl as most of her care is at [facility].   Total time spent: 45 minutes reviewing past records through care everywhere including office notes, echocardiogram report, labs, discussing disease management.  FINAL MEDICATION LIST END OF ENCOUNTER: No orders of the defined types were placed in this  encounter.   Current Outpatient Medications:    acetaminophen (TYLENOL) 500 MG tablet, Take 1,000 mg by mouth every 8 (eight) hours as needed for mild pain or moderate pain., Disp: , Rfl:    albuterol (ACCUNEB) 0.63 MG/3ML nebulizer solution, as needed., Disp: , Rfl:    albuterol (VENTOLIN HFA) 108 (90 Base) MCG/ACT inhaler, Inhale 2 puffs into the lungs every 6 (six) hours as needed., Disp: , Rfl:    amitriptyline (ELAVIL) 100 MG tablet, Take 100 mg by mouth at bedtime., Disp: , Rfl:    atorvastatin (LIPITOR) 10 MG tablet, Take 1 tablet by mouth daily., Disp: , Rfl:    budesonide-formoterol (SYMBICORT) 160-4.5 MCG/ACT inhaler, Inhale 2 puffs into the lungs 2 (two) times daily., Disp: , Rfl:    diltiazem (CARDIZEM LA) 240 MG 24 hr tablet, Take 1 tablet by mouth daily. , Disp: , Rfl:    fluticasone (FLOVENT HFA) 110 MCG/ACT inhaler, Inhale into the lungs as needed., Disp: , Rfl:    Insulin Lispro Prot & Lispro (HUMALOG MIX 75/25 KWIKPEN) (75-25) 100 UNIT/ML Kwikpen, 25 Units daily., Disp: , Rfl:    lubiprostone (AMITIZA) 24 MCG capsule, Take 24 mcg by mouth 2 (two) times daily with a meal., Disp: , Rfl:    magnesium gluconate (MAGONATE) 500 MG tablet, Take 2 tablets by mouth daily., Disp: , Rfl:    metoprolol succinate (TOPROL-XL) 100 MG 24 hr tablet, Take 100 mg by mouth in the morning. Take with or immediately following a meal., Disp: , Rfl:    metoprolol succinate (TOPROL-XL) 50 MG 24 hr tablet, Take 50 mg by mouth at bedtime. Take with or immediately following a meal., Disp: , Rfl:    mirtazapine (REMERON) 30 MG tablet, Take 30 mg by mouth at bedtime., Disp: , Rfl:    oxyCODONE-acetaminophen (PERCOCET) 10-325 MG tablet, Take 1 tablet by mouth 3 (three) times daily., Disp: , Rfl:    potassium chloride SA (KLOR-CON) 20 MEQ tablet, Take 1 tablet by mouth daily., Disp: , Rfl:    pregabalin (LYRICA) 100 MG capsule, Take 100 mg by mouth 3 (three) times daily., Disp: , Rfl:    Orders Placed This Encounter  Procedures   EKG 12-Lead    There are no Patient Instructions on file for this visit.   --Continue cardiac medications as reconciled in final  medication list. --Return in about 6 weeks (around 05/26/2020) for Reevaluation of palpitations. Or sooner if needed. --Continue follow-up with your primary care physician regarding the management of your other chronic comorbid conditions.  Patient's questions and concerns were addressed to her satisfaction. She voices understanding of the instructions provided during this encounter.   This note was created using a voice recognition software as a result there may be grammatical errors inadvertently enclosed that do not reflect the nature of this encounter. Every attempt is made to correct such errors.  Rex Kras, [place], [facility]  Pager: [phone] Office: [phone]

## 2020-05-26 ENCOUNTER — Ambulatory Visit: Payer: Medicare Other | Admitting: Cardiology

## 2021-01-25 ENCOUNTER — Other Ambulatory Visit: Payer: Self-pay | Admitting: Physician Assistant

## 2021-01-25 DIAGNOSIS — Z1231 Encounter for screening mammogram for malignant neoplasm of breast: Secondary | ICD-10-CM

## 2022-07-19 ENCOUNTER — Ambulatory Visit: Payer: Medicare Other | Admitting: Internal Medicine

## 2022-07-19 NOTE — Progress Notes (Deleted)
Cardiology Office Note:    Date:  07/19/2022   ID:  Michelle Duffy, DOB 1975-08-04, MRN [id number]  PCP:  Michelle Duffy, Michelle Providers Cardiologist:  None { Click to update primary MD,subspecialty MD or APP then REFRESH:1}    Referring MD: Michelle Ginger, PA-C   CC: *** Consulted for the evaluation of P-SVT at the behest of Michelle Duffy   History of Present Illness:    Michelle Duffy is a 47 y.o. female with a hx of HTN with DM I, HLD, COPD, for evaluation of P-SVT. Prior hx of COVID-19. Seen previously by Michelle Duffy at [facility]. Normal Echo at that evaluation.  Normal monitors at those institutions. Seen by Michelle Duffy in 2021.  Patient notes that (s)he is feeling ***.   Was last feeling well ***. Able to ***  Has had no chest pain, chest pressure, chest tightness, chest stinging ***.  Discomfort occurs with ***, worsens with ***, and improves with ***.    Patient exertion notable for *** with *** and feels no symptoms.    No shortness of breath, DOE ***.  No PND or orthopnea***.  No weight gain***, leg swelling ***, or abdominal swelling***.  No syncope or near syncope ***. Notes *** no palpitations or funny heart beats.     Patient reports prior cardiac testing including ***  No history of ***pre-eclampsia, gestation HTN or gestational DM.  No Fen-Phen or drug use***.  Ambulatory BP ***.   Past Medical History:  Diagnosis Date   COPD (chronic obstructive pulmonary disease) ([place])    DM (diabetes mellitus) with complications ([place])    Hyperlipidemia    Hypertension    Oxygen dependent    PSVT (paroxysmal supraventricular tachycardia) ([place])    Trichomonas infection 07/03/2017    Past Surgical History:  Procedure Laterality Date   CESAREAN SECTION     CESAREAN SECTION WITH BILATERAL TUBAL LIGATION  2003   LAPAROSCOPY N/A 05/29/2013   Procedure: LAPAROSCOPY OPERATIVE with Right Salpingostomy;  Surgeon: Jonnie Kind, MD;  Location: [facility];   Service: Gynecology;  Laterality: N/A;   OTHER SURGICAL HISTORY  2012   BTL reversal   revers     SALPINGECTOMY Left    ruptured ectopic   TUBAL LIGATION      Current Medications: No outpatient medications have been marked as taking for the 07/19/22 encounter (Appointment) with Michelle Lean, MD.     Allergies:   Patient has no known allergies.   Social History   Socioeconomic History   Marital status: Married    Spouse name: Not on file   Number of children: 3   Years of education: Not on file   Highest education level: Not on file  Occupational History   Not on file  Tobacco Use   Smoking status: Former    Packs/day: 1.00    Years: 8.00    Total pack years: 8.00    Types: Cigarettes    Quit date: 2020    Years since quitting: 4.1   Smokeless tobacco: Never  Vaping Use   Vaping Use: Never used  Substance and Sexual Activity   Alcohol use: No   Drug use: No   Sexual activity: Yes    Birth control/protection: None  Other Topics Concern   Not on file  Social History Narrative   Not on file   Social Determinants of Health   Financial Resource Strain: Not on file  Food Insecurity: Not  on file  Transportation Needs: Not on file  Physical Activity: Not on file  Stress: Not on file  Social Connections: Not on file     Family History: The patient's ***family history includes Heart attack in her father; Heart disease in her mother; Heart failure in her mother; Stroke in her mother. There is no history of Alcohol abuse, Arthritis, Asthma, Birth defects, Cancer, COPD, Depression, Diabetes, Drug abuse, Early death, Hearing loss, Hyperlipidemia, Hypertension, Kidney disease, Learning disabilities, Mental illness, Mental retardation, Miscarriages / Stillbirths, Vision loss, or Varicose Veins.  ROS:   Please see the history of present illness.    *** All other systems reviewed and are negative.  EKGs/Labs/Other Studies Reviewed:    The following studies were  reviewed today: ***  EKG:  EKG is *** ordered today.  The ekg ordered today demonstrates ***  Recent Labs: No results found for requested labs within last 365 days.  Recent Lipid Panel No results found for: "CHOL", "TRIG", "HDL", "CHOLHDL", "VLDL", "LDLCALC", "LDLDIRECT"   Risk Assessment/Calculations:   {Does this patient have ATRIAL FIBRILLATION?[phone]}  No BP recorded.  {Refresh Note OR Click here to enter BP  :1}***         Physical Exam:    VS:  There were no vitals taken for this visit.    Wt Readings from Last 3 Encounters:  04/14/20 154 lb (69.9 kg)  07/03/17 133 lb 4 oz (60.4 kg)  09/08/14 128 lb 2 oz (58.1 kg)     GEN: *** Well nourished, well developed in no acute distress HEENT: Normal NECK: No JVD; No carotid bruits LYMPHATICS: No lymphadenopathy CARDIAC: ***RRR, no murmurs, rubs, gallops RESPIRATORY:  Clear to auscultation without rales, wheezing or rhonchi  ABDOMEN: Soft, non-tender, non-distended MUSCULOSKELETAL:  No edema; No deformity  SKIN: Warm and dry NEUROLOGIC:  Alert and oriented x 3 PSYCHIATRIC:  Normal affect   ASSESSMENT:    No diagnosis found. PLAN:    Palpitations; possible SVT PACs*** - will obtain ***-day live/non live heart monitor (ZioPatch/[employer]) - AV Nodal Therapy: *** - AAD:*** - Lifestyle changes: *** - EP evaluation:   - TSH: ***   HTN with Type I DM  HLD Family history of early CAD      {Are you ordering a CV Procedure (e.g. stress test, cath, DCCV, TEE, etc)?   Press F2        :[id number]}    Medication Adjustments/Labs and Tests Ordered: Current medicines are reviewed at length with the patient today.  Concerns regarding medicines are outlined above.  No orders of the defined types were placed in this encounter.  No orders of the defined types were placed in this encounter.   There are no Patient Instructions on file for this visit.   Signed, Michelle Lean, MD  07/19/2022 8:10 AM     [place]

## 2022-07-30 NOTE — Progress Notes (Deleted)
Cardiology Office Note:    Date:  07/30/2022   ID:  Basya Gruenberg, DOB February 27, 1976, MRN [id number]  PCP:  Wynelle Fanny, DO  Cardiologist:  None  Electrophysiologist:  None   Referring MD: Wynelle Fanny, DO   No chief complaint on file. ***  History of Present Illness:    Krosby Bring is a 47 y.o. female with a hx of SVT, COPD, diabetes, hypertension, hyperlipidemia who is referred by Dr. Wynn Banker for evaluation of SVT.  Previously followed with Dr. Terri Skains, last seen in 2021.  Also has been seen at [facility] cardiology, last seen 06/2020.  Echocardiogram 02/2019 showed normal LV systolic function, normal diastolic function, no significant valvular disease.  Past Medical History:  Diagnosis Date   COPD (chronic obstructive pulmonary disease) ([place])    DM (diabetes mellitus) with complications ([place])    Hyperlipidemia    Hypertension    Oxygen dependent    PSVT (paroxysmal supraventricular tachycardia) ([place])    Trichomonas infection 07/03/2017    Past Surgical History:  Procedure Laterality Date   CESAREAN SECTION     CESAREAN SECTION WITH BILATERAL TUBAL LIGATION  2003   LAPAROSCOPY N/A 05/29/2013   Procedure: LAPAROSCOPY OPERATIVE with Right Salpingostomy;  Surgeon: Jonnie Kind, MD;  Location: [facility];  Service: Gynecology;  Laterality: N/A;   OTHER SURGICAL HISTORY  2012   BTL reversal   revers     SALPINGECTOMY Left    ruptured ectopic   TUBAL LIGATION      Current Medications: No outpatient medications have been marked as taking for the 08/01/22 encounter (Appointment) with Donato Heinz, MD.     Allergies:   Patient has no known allergies.   Social History   Socioeconomic History   Marital status: Married    Spouse name: Not on file   Number of children: 3   Years of education: Not on file   Highest education level: Not on file  Occupational History   Not on file  Tobacco Use   Smoking status: Former    Packs/day: 1.00    Years: 8.00    Total  pack years: 8.00    Types: Cigarettes    Quit date: 2020    Years since quitting: 4.1   Smokeless tobacco: Never  Vaping Use   Vaping Use: Never used  Substance and Sexual Activity   Alcohol use: No   Drug use: No   Sexual activity: Yes    Birth control/protection: None  Other Topics Concern   Not on file  Social History Narrative   Not on file   Social Determinants of Health   Financial Resource Strain: Not on file  Food Insecurity: Not on file  Transportation Needs: Not on file  Physical Activity: Not on file  Stress: Not on file  Social Connections: Not on file     Family History: The patient's ***family history includes Heart attack in her father; Heart disease in her mother; Heart failure in her mother; Stroke in her mother. There is no history of Alcohol abuse, Arthritis, Asthma, Birth defects, Cancer, COPD, Depression, Diabetes, Drug abuse, Early death, Hearing loss, Hyperlipidemia, Hypertension, Kidney disease, Learning disabilities, Mental illness, Mental retardation, Miscarriages / Stillbirths, Vision loss, or Varicose Veins.  ROS:   Please see the history of present illness.    *** All other systems reviewed and are negative.  EKGs/Labs/Other Studies Reviewed:    The following studies were reviewed today: ***  EKG:  EKG is *** ordered today.  The ekg ordered today demonstrates ***  Recent Labs: No results found for requested labs within last 365 days.  Recent Lipid Panel No results found for: "CHOL", "TRIG", "HDL", "CHOLHDL", "VLDL", "LDLCALC", "LDLDIRECT"  Physical Exam:    VS:  There were no vitals taken for this visit.    Wt Readings from Last 3 Encounters:  04/14/20 154 lb (69.9 kg)  07/03/17 133 lb 4 oz (60.4 kg)  09/08/14 128 lb 2 oz (58.1 kg)     GEN: *** Well nourished, well developed in no acute distress HEENT: Normal NECK: No JVD; No carotid bruits LYMPHATICS: No lymphadenopathy CARDIAC: ***RRR, no murmurs, rubs, gallops RESPIRATORY:   Clear to auscultation without rales, wheezing or rhonchi  ABDOMEN: Soft, non-tender, non-distended MUSCULOSKELETAL:  No edema; No deformity  SKIN: Warm and dry NEUROLOGIC:  Alert and oriented x 3 PSYCHIATRIC:  Normal affect   ASSESSMENT:    No diagnosis found. PLAN:    SVT: On Toprol-XL 100 mg every morning/50 mg nightly and diltiazem 240 mg daily.  Echocardiogram 02/2019 showed normal LV systolic function, normal diastolic function, no significant valvular disease.  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  On home O2  RTC in***  Medication Adjustments/Labs and Tests Ordered: Current medicines are reviewed at length with the patient today.  Concerns regarding medicines are outlined above.  No orders of the defined types were placed in this encounter.  No orders of the defined types were placed in this encounter.   There are no Patient Instructions on file for this visit.   Signed, Donato Heinz, MD  07/30/2022 3:17 PM    [facility]

## 2022-08-01 ENCOUNTER — Ambulatory Visit: Payer: 59 | Attending: Cardiology | Admitting: Cardiology

## 2023-07-24 ENCOUNTER — Emergency Department (HOSPITAL_BASED_OUTPATIENT_CLINIC_OR_DEPARTMENT_OTHER)
Admission: EM | Admit: 2023-07-24 | Discharge: 2023-07-24 | Disposition: A | Discharge status: Home / Self Care | Payer: 59 | Attending: Emergency Medicine | Admitting: Emergency Medicine

## 2023-07-24 ENCOUNTER — Emergency Department (HOSPITAL_BASED_OUTPATIENT_CLINIC_OR_DEPARTMENT_OTHER): Payer: 59

## 2023-07-24 ENCOUNTER — Encounter (HOSPITAL_BASED_OUTPATIENT_CLINIC_OR_DEPARTMENT_OTHER): Payer: Self-pay

## 2023-07-24 DIAGNOSIS — Z794 Long term (current) use of insulin: Secondary | ICD-10-CM | POA: Diagnosis not present

## 2023-07-24 DIAGNOSIS — E119 Type 2 diabetes mellitus without complications: Secondary | ICD-10-CM | POA: Insufficient documentation

## 2023-07-24 DIAGNOSIS — S62666A Nondisplaced fracture of distal phalanx of right little finger, initial encounter for closed fracture: Secondary | ICD-10-CM

## 2023-07-24 DIAGNOSIS — M79601 Pain in right arm: Secondary | ICD-10-CM | POA: Insufficient documentation

## 2023-07-24 DIAGNOSIS — Z79899 Other long term (current) drug therapy: Secondary | ICD-10-CM | POA: Insufficient documentation

## 2023-07-24 DIAGNOSIS — R519 Headache, unspecified: Secondary | ICD-10-CM | POA: Diagnosis not present

## 2023-07-24 DIAGNOSIS — R42 Dizziness and giddiness: Secondary | ICD-10-CM | POA: Insufficient documentation

## 2023-07-24 DIAGNOSIS — I10 Essential (primary) hypertension: Secondary | ICD-10-CM | POA: Diagnosis not present

## 2023-07-24 DIAGNOSIS — Y92410 Unspecified street and highway as the place of occurrence of the external cause: Secondary | ICD-10-CM | POA: Insufficient documentation

## 2023-07-24 DIAGNOSIS — J449 Chronic obstructive pulmonary disease, unspecified: Secondary | ICD-10-CM | POA: Diagnosis not present

## 2023-07-24 DIAGNOSIS — S060XAA Concussion with loss of consciousness status unknown, initial encounter: Secondary | ICD-10-CM

## 2023-07-24 MED ORDER — IBUPROFEN 600 MG PO TABS: 600.0000 mg | ORAL_TABLET | Freq: Four times a day (QID) | ORAL | 0 refills | Status: AC | PRN

## 2023-07-24 MED ORDER — ONDANSETRON 4 MG PO TBDP: 4.0000 mg | ORAL_TABLET | Freq: Three times a day (TID) | ORAL | 0 refills | Status: AC | PRN

## 2023-07-24 MED ORDER — CYCLOBENZAPRINE HCL 10 MG PO TABS
10.0000 mg | ORAL_TABLET | Freq: Two times a day (BID) | ORAL | 0 refills | Status: AC | PRN
Start: 2023-07-24 — End: ?

## 2023-07-24 MED ORDER — ACETAMINOPHEN 500 MG PO TABS
1000.0000 mg | ORAL_TABLET | Freq: Once | ORAL | Status: AC
  Administered 2023-07-24: 1000 mg via ORAL
  Filled 2023-07-24: qty 2

## 2023-07-24 MED ORDER — PROCHLORPERAZINE EDISYLATE 10 MG/2ML IJ SOLN
10.0000 mg | Freq: Once | INTRAMUSCULAR | Status: AC
  Administered 2023-07-24: 10 mg via INTRAMUSCULAR
  Filled 2023-07-24: qty 2

## 2023-07-24 MED ORDER — KETOROLAC TROMETHAMINE 60 MG/2ML IM SOLN
30.0000 mg | Freq: Once | INTRAMUSCULAR | Status: AC
  Administered 2023-07-24: 30 mg via INTRAMUSCULAR
  Filled 2023-07-24: qty 2

## 2023-07-24 MED ORDER — ONDANSETRON 4 MG PO TBDP
8.0000 mg | ORAL_TABLET | Freq: Once | ORAL | Status: AC
  Administered 2023-07-24: 8 mg via ORAL
  Filled 2023-07-24: qty 2
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
## 2023-07-24 NOTE — ED Notes (Signed)
EDP at bedside
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
## 2023-09-20 ENCOUNTER — Other Ambulatory Visit: Payer: Self-pay | Admitting: Physician Assistant

## 2023-09-20 DIAGNOSIS — Z1231 Encounter for screening mammogram for malignant neoplasm of breast: Secondary | ICD-10-CM
